# Patient Record
Sex: Female | Born: 1955
Health system: Southern US, Community
[De-identification: ages and names within clinical notes are randomized; demographics above are authoritative.]

## PROBLEM LIST (undated history)

## (undated) DIAGNOSIS — T7840XA Allergy, unspecified, initial encounter: Secondary | ICD-10-CM

## (undated) DIAGNOSIS — F909 Attention-deficit hyperactivity disorder, unspecified type: Secondary | ICD-10-CM

## (undated) DIAGNOSIS — F329 Major depressive disorder, single episode, unspecified: Secondary | ICD-10-CM

## (undated) DIAGNOSIS — K5190 Ulcerative colitis, unspecified, without complications: Secondary | ICD-10-CM

## (undated) DIAGNOSIS — F419 Anxiety disorder, unspecified: Secondary | ICD-10-CM

## (undated) DIAGNOSIS — M1990 Unspecified osteoarthritis, unspecified site: Secondary | ICD-10-CM

## (undated) DIAGNOSIS — F32A Depression, unspecified: Secondary | ICD-10-CM

## (undated) DIAGNOSIS — K219 Gastro-esophageal reflux disease without esophagitis: Secondary | ICD-10-CM

## (undated) HISTORY — PX: BACK SURGERY: SHX140

## (undated) HISTORY — DX: Depression, unspecified: F32.A

## (undated) HISTORY — DX: Major depressive disorder, single episode, unspecified: F32.9

## (undated) HISTORY — DX: Attention-deficit hyperactivity disorder, unspecified type: F90.9

## (undated) HISTORY — PX: JOINT REPLACEMENT: SHX530

## (undated) HISTORY — DX: Ulcerative colitis, unspecified, without complications: K51.90

## (undated) HISTORY — PX: SPINE SURGERY: SHX786

## (undated) HISTORY — DX: Anxiety disorder, unspecified: F41.9

## (undated) HISTORY — PX: BUNIONECTOMY: SHX129

## (undated) HISTORY — DX: Allergy, unspecified, initial encounter: T78.40XA

## (undated) HISTORY — PX: EYE SURGERY: SHX253

## (undated) HISTORY — DX: Unspecified osteoarthritis, unspecified site: M19.90

---

## 1979-06-06 HISTORY — PX: ABDOMINAL HYSTERECTOMY: SHX81

## 2006-11-21 ENCOUNTER — Ambulatory Visit: Payer: Self-pay | Admitting: Family Medicine

## 2007-03-21 ENCOUNTER — Ambulatory Visit: Payer: Self-pay | Admitting: Specialist

## 2008-07-17 IMAGING — CR DG LUMBAR SPINE COMPLETE 4+V
2 series · 5 of 5 positions shown · non-contrast
Comparison: none

REASON FOR EXAM: lower back pain
COMMENTS:

[view not recorded (1 of 2)]
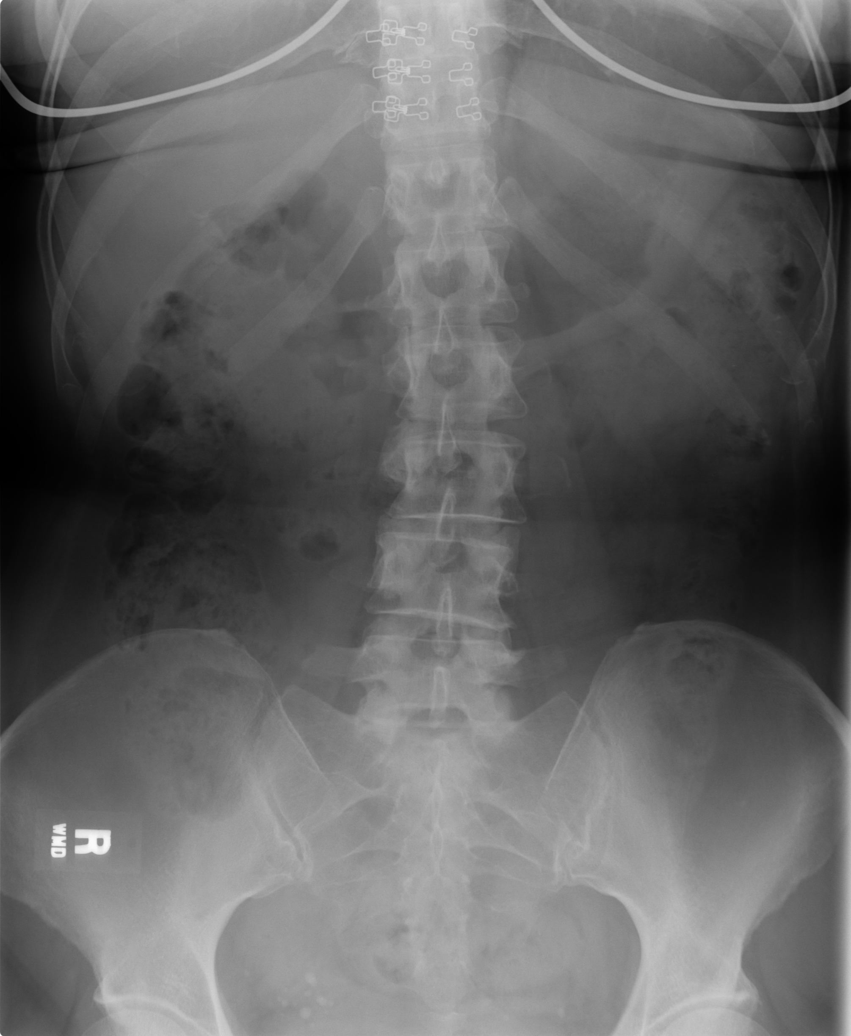

[Series 2: view not recorded · 0.17mm/px · 4 of 4 slices shown (2 of 2)]
[im 1/4]
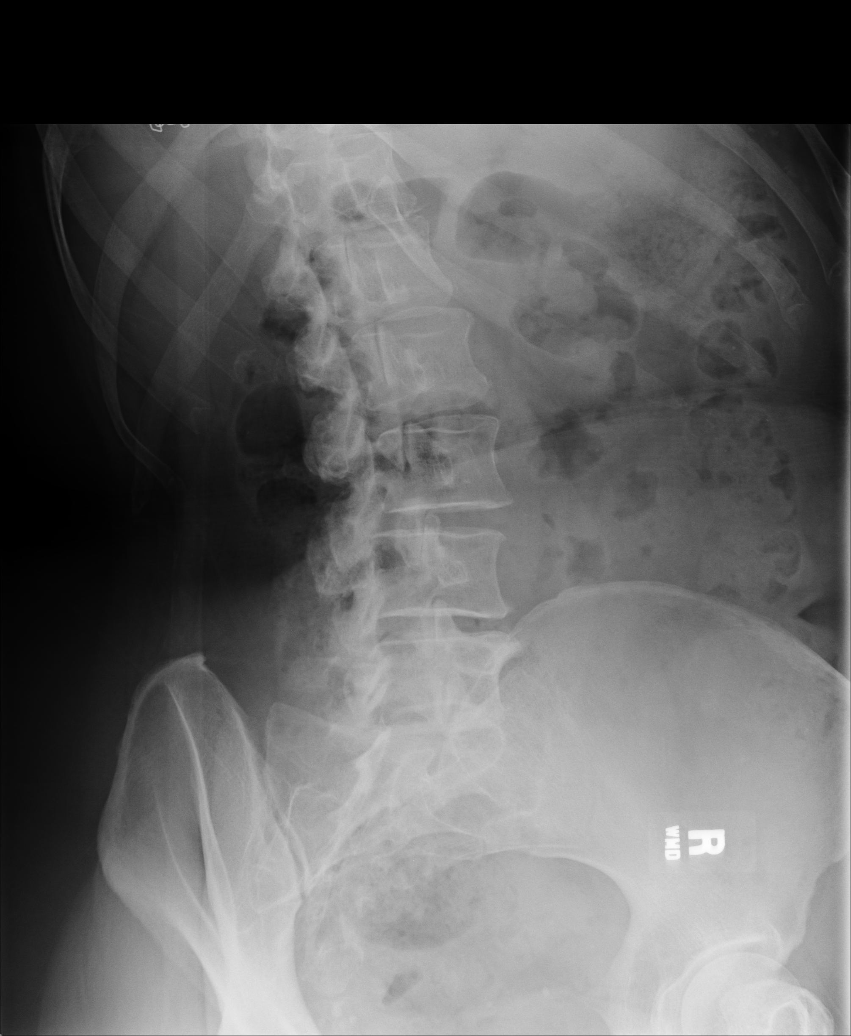
[im 2/4]
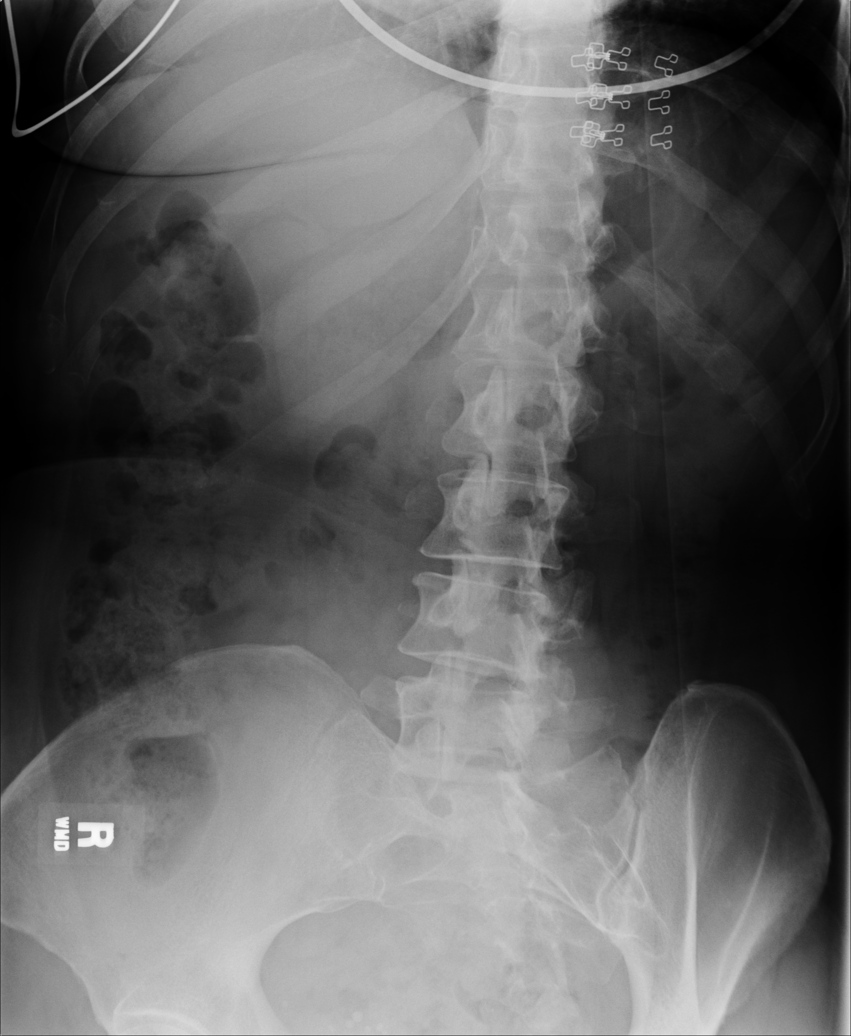
[im 3/4]
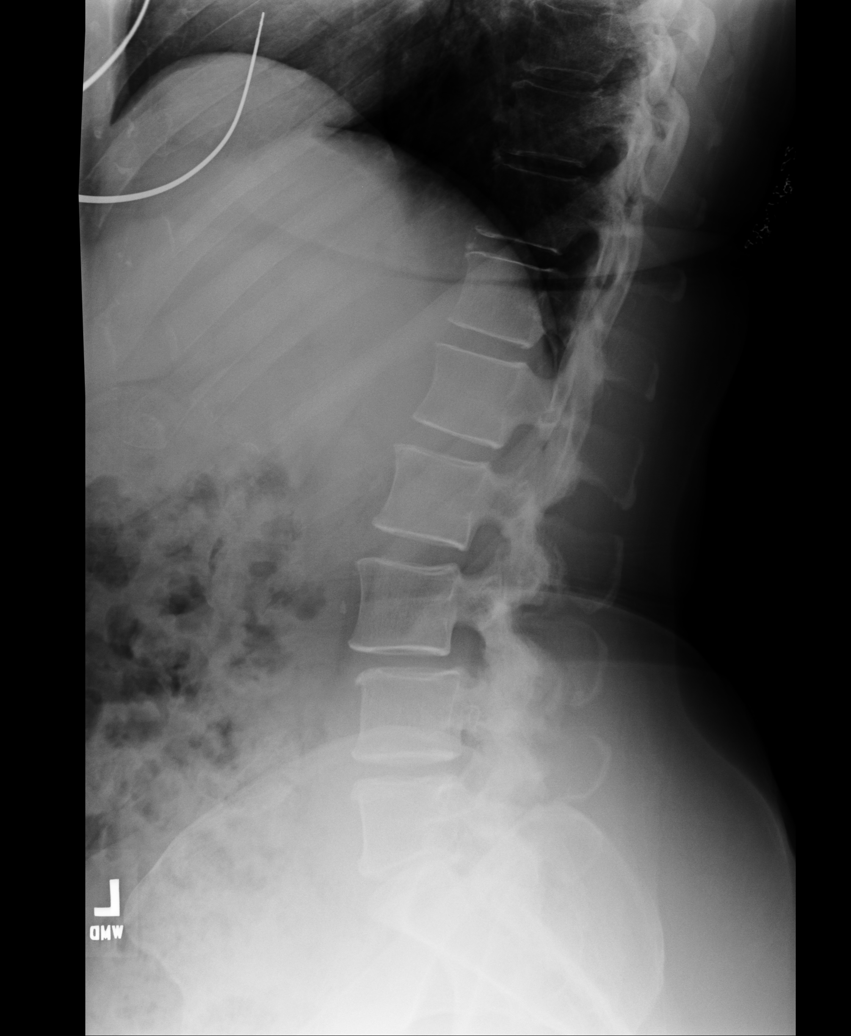
[im 4/4]
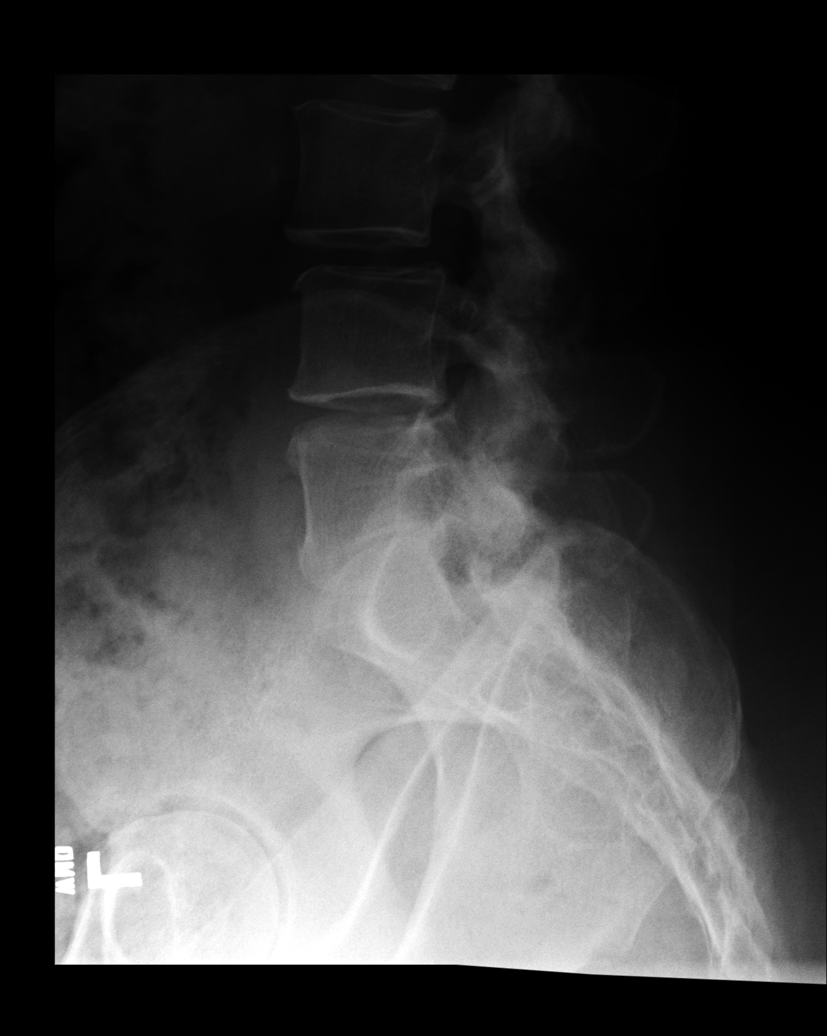

[5 of 5 positions shown; findings below may reference images not displayed]

PROCEDURE:     KDR - KDXR LUMBAR SPINE WITH OBLIQUES  - November 21, 2006 [DATE]

RESULT:     Images of the lumbar spine demonstrate disc space narrowing at
L5-S1 and to a lesser extent L4-L5 with multilevel lower lumbar facet
hypertrophy most pronounced at L4-L5 and L5-S1. No fracture is identified.
There is minimal scoliosis concave to the RIGHT at L2-L3.
IMPRESSION: Bony degenerative changes. No definite fracture
demonstrated. MRI is available for further evaluation if desired.

## 2008-08-11 ENCOUNTER — Ambulatory Visit: Payer: Self-pay | Admitting: Gastroenterology

## 2009-01-20 ENCOUNTER — Ambulatory Visit: Payer: Self-pay | Admitting: Gastroenterology

## 2010-05-31 ENCOUNTER — Ambulatory Visit: Payer: Self-pay | Admitting: Ophthalmology

## 2010-07-27 ENCOUNTER — Ambulatory Visit: Payer: Self-pay | Admitting: Ophthalmology

## 2011-03-01 DIAGNOSIS — K5190 Ulcerative colitis, unspecified, without complications: Secondary | ICD-10-CM | POA: Insufficient documentation

## 2011-03-01 DIAGNOSIS — H2000 Unspecified acute and subacute iridocyclitis: Secondary | ICD-10-CM | POA: Insufficient documentation

## 2011-11-22 DIAGNOSIS — K5150 Left sided colitis without complications: Secondary | ICD-10-CM | POA: Insufficient documentation

## 2012-10-11 LAB — HM PAP SMEAR: HM PAP: NEGATIVE

## 2014-08-07 LAB — HM COLONOSCOPY: HM COLON: NORMAL

## 2014-12-03 ENCOUNTER — Ambulatory Visit: Payer: 59 | Admitting: Psychiatry

## 2014-12-10 ENCOUNTER — Other Ambulatory Visit: Payer: Self-pay

## 2014-12-10 DIAGNOSIS — F988 Other specified behavioral and emotional disorders with onset usually occurring in childhood and adolescence: Secondary | ICD-10-CM | POA: Insufficient documentation

## 2014-12-10 NOTE — Telephone Encounter (Signed)
pt called left message on answering machine that she was out of adderall and needed refill pt has appt on 12-29-14

## 2014-12-14 MED ORDER — AMPHETAMINE-DEXTROAMPHETAMINE 10 MG PO TABS: 10.0000 mg | ORAL_TABLET | Freq: Every day | ORAL | Status: DC

## 2014-12-14 NOTE — Telephone Encounter (Signed)
pt was called rx will be at front desk for pick up. pt states it will be sometime tomorrow

## 2014-12-29 ENCOUNTER — Encounter: Payer: Self-pay | Admitting: Psychiatry

## 2014-12-29 ENCOUNTER — Ambulatory Visit (INDEPENDENT_AMBULATORY_CARE_PROVIDER_SITE_OTHER): Payer: 59 | Admitting: Psychiatry

## 2014-12-29 VITALS — BP 120/72 | HR 84 | Temp 97.9°F | Ht 63.5 in | Wt 188.2 lb

## 2014-12-29 DIAGNOSIS — F900 Attention-deficit hyperactivity disorder, predominantly inattentive type: Secondary | ICD-10-CM | POA: Diagnosis not present

## 2014-12-29 MED ORDER — HYDROXYZINE PAMOATE 50 MG PO CAPS: 100.0000 mg | ORAL_CAPSULE | Freq: Every evening | ORAL | Status: DC | PRN

## 2014-12-29 MED ORDER — AMPHETAMINE-DEXTROAMPHETAMINE 20 MG PO TABS: 20.0000 mg | ORAL_TABLET | ORAL | Status: DC

## 2014-12-29 NOTE — Progress Notes (Signed)
BH MD/PA/NP OP Progress Note  12/29/2014 10:28 AM Alison Kramer  MRN:  [id number]  Subjective:  Patient returns a follow-up of her ADHD. She states the Adderall is been helpful. However she states that the main thing and helps her with is to initiate task however she's continues to report difficulty shifting between different task. She states that overall her mood is been good but she is a little tense and anxious because they have numerous issues going on (i.e. trying to refinance their house). She states however she looks forward to an upcoming trip to the beach.  He stated that she can sleep fairly well with the Vistaril but she still typically been taking 50 mg and then if she wakes up she takes another 50 mg. She states however when she was taking 250 mg at once it allowed her to fall asleep and continue to sleep. She states when she takes single 50 mg dose initially that there is a delay in her being able to fall asleep and that she wakes up at times.  He denies any side effects from the medications such as headache, decreased appetite. Chief Complaint: "Trouble shifting" between tasks Chief Complaint    Follow-up     Visit Diagnosis:  No diagnosis found.  Past Medical History:  Past Medical History  Diagnosis Date  . Anxiety   . Depression   . ADHD (attention deficit hyperactivity disorder)     Past Surgical History  Procedure Laterality Date  . Abdominal hysterectomy    . Bunionectomy    . Back surgery     Family History:  Family History  Problem Relation Age of Onset  . Breast cancer Mother   . Hyperlipidemia Mother   . Heart Problems Father   . Hypertension Brother   . Obesity Brother   . Heart Problems Brother   . Diabetes Brother    Social History:  History   Social History  . Marital Status: Married    Spouse Name: N/A  . Number of Children: N/A  . Years of Education: N/A   Social History Main Topics  . Smoking status: Never Smoker   . Smokeless  tobacco: Never Used  . Alcohol Use: 3.6 - 4.2 oz/week    0 Standard drinks or equivalent, 4 Glasses of wine, 2-3 Shots of liquor per week  . Drug Use: No  . Sexual Activity: Yes    Birth Control/ Protection: None   Other Topics Concern  . None   Social History Narrative  . None   Additional History:   Assessment:   Musculoskeletal: Strength & Muscle Tone: within normal limits Gait & Station: normal Patient leans: N/A  Psychiatric Specialty Exam: HPI  Review of Systems  Psychiatric/Behavioral: Negative for depression, suicidal ideas, hallucinations, memory loss and substance abuse. The patient has insomnia. The patient is not nervous/anxious.     Blood pressure 120/72, pulse 84, temperature 97.9 F (36.6 C), temperature source Tympanic, height 5' 3.5" (1.613 m), weight 188 lb 3.2 oz (85.367 kg), SpO2 96 %.Body mass index is 32.81 kg/(m^2).  General Appearance: Neat and Well Groomed  Eye Contact:  Good  Speech:  Normal Rate  Volume:  Normal  Mood:  Good  Affect:  Congruent  Thought Process:  Linear and Logical  Orientation:  Full (Time, Place, and Person)  Thought Content:  Negative  Suicidal Thoughts:  No  Homicidal Thoughts:  No  Memory:  Immediate;   Good Recent;   Good Remote;  Good  Judgement:  Good  Insight:  Good  Psychomotor Activity:  Negative  Concentration:  Good  Recall:  Good  Fund of Knowledge: Good  Language: Good  Akathisia:  Negative  Handed:  Right unknown   AIMS (if indicated):  N/A  Assets:  Communication Skills Desire for Improvement Social Support  ADL's:  Intact  Cognition: WNL  Sleep:  9 hours  With medicine, some sleep onset insomnia   Is the patient at risk to self?  No. Has the patient been a risk to self in the past 6 months?  No. Has the patient been a risk to self within the distant past?  No. Is the patient a risk to others?  No. Has the patient been a risk to others in the past 6 months?  No. Has the patient been a risk  to others within the distant past?  No.  Current Medications: Current Outpatient Prescriptions  Medication Sig Dispense Refill  . amphetamine-dextroamphetamine (ADDERALL) 20 MG tablet Take 1 tablet (20 mg total) by mouth every morning. 30 tablet 0  . cholecalciferol (VITAMIN D) 1000 UNITS tablet Take by mouth.    . hydrOXYzine (VISTARIL) 50 MG capsule Take 2 capsules (100 mg total) by mouth at bedtime as needed (insomnia). 60 capsule 2  . mesalamine (LIALDA) 1.2 G EC tablet Take by mouth.     No current facility-administered medications for this visit.    Medical Decision Making:  Established Problem, Stable/Improving (1) and Review of New Medication or Change in Dosage (2)  Treatment Plan Summary:Medication management and Plan Will increase her Adderall to 20 mg in the morning. We will change her Vistaril dosing to 100 mg at bedtime as needed. Patient will follow up in 1 month. She's been encouraged call any questions or concerns prior to her next appointment. Given a hard copy prescription for Adderall 20 mg, #30 with no refills. I've sent in to her pharmacy Vistaril 50 mg tablets, #60 with 2 refills.   [name] 12/29/2014, 10:28 AM

## 2015-01-29 ENCOUNTER — Ambulatory Visit (INDEPENDENT_AMBULATORY_CARE_PROVIDER_SITE_OTHER): Payer: 59 | Admitting: Psychiatry

## 2015-01-29 ENCOUNTER — Encounter: Payer: Self-pay | Admitting: Psychiatry

## 2015-01-29 VITALS — BP 125/107 | HR 84 | Temp 98.4°F | Ht 64.0 in | Wt 186.2 lb

## 2015-01-29 DIAGNOSIS — F900 Attention-deficit hyperactivity disorder, predominantly inattentive type: Secondary | ICD-10-CM | POA: Diagnosis not present

## 2015-01-29 DIAGNOSIS — G4700 Insomnia, unspecified: Secondary | ICD-10-CM

## 2015-01-29 MED ORDER — TEMAZEPAM 7.5 MG PO CAPS: 7.5000 mg | ORAL_CAPSULE | Freq: Every evening | ORAL | Status: DC | PRN

## 2015-01-29 MED ORDER — AMPHETAMINE-DEXTROAMPHETAMINE 20 MG PO TABS: 20.0000 mg | ORAL_TABLET | ORAL | Status: DC

## 2015-01-29 NOTE — Progress Notes (Signed)
BH MD/PA/NP OP Progress Note  01/29/2015 8:55 AM Alison Kramer  MRN:  [id number]  Subjective:  Patient returns a follow-up of her ADHD. Patient states that the Adderall continues to work well. She states her main issue today is sleep. States that even with the Vistaril at taking her longtime to go to sleep. We discussed alternatives to Vistaril such as Ambien. However patient was concerned about the risk of behaviors in sleep. As such she indicated she has had a past trial of diazepam and that that worked. I did discuss that if we were going to look at that class perhaps to temezapam would be a better choice. Chief Complaint: "sleep" Chief Complaint    Follow-up; Medication Refill; Fatigue     Visit Diagnosis:  No diagnosis found.  Past Medical History:  Past Medical History  Diagnosis Date  . Anxiety   . Depression   . ADHD (attention deficit hyperactivity disorder)     Past Surgical History  Procedure Laterality Date  . Abdominal hysterectomy    . Bunionectomy    . Back surgery     Family History:  Family History  Problem Relation Age of Onset  . Breast cancer Mother   . Hyperlipidemia Mother   . Heart Problems Father   . Hypertension Brother   . Obesity Brother   . Heart Problems Brother   . Diabetes Brother    Social History:  Social History   Social History  . Marital Status: Married    Spouse Name: N/A  . Number of Children: N/A  . Years of Education: N/A   Social History Main Topics  . Smoking status: Never Smoker   . Smokeless tobacco: Never Used  . Alcohol Use: 3.6 - 4.2 oz/week    0 Standard drinks or equivalent, 4 Glasses of wine, 2-3 Shots of liquor per week  . Drug Use: No  . Sexual Activity: Yes    Birth Control/ Protection: None   Other Topics Concern  . None   Social History Narrative   Additional History:   Assessment:   Musculoskeletal: Strength & Muscle Tone: within normal limits Gait & Station: normal Patient leans:  N/A  Psychiatric Specialty Exam: HPI  Review of Systems  Psychiatric/Behavioral: Negative for depression, suicidal ideas, hallucinations, memory loss and substance abuse. The patient has insomnia. The patient is not nervous/anxious.     Blood pressure 125/107, pulse 84, temperature 98.4 F (36.9 C), temperature source Tympanic, height 5' 4"  (1.626 m), weight 186 lb 3.2 oz (84.46 kg), SpO2 98 %.Body mass index is 31.95 kg/(m^2).  General Appearance: Neat and Well Groomed  Eye Contact:  Good  Speech:  Normal Rate  Volume:  Normal  Mood:  Good  Affect:  Congruent  Thought Process:  Linear and Logical  Orientation:  Full (Time, Place, and Person)  Thought Content:  Negative  Suicidal Thoughts:  No  Homicidal Thoughts:  No  Memory:  Immediate;   Good Recent;   Good Remote;   Good  Judgement:  Good  Insight:  Good  Psychomotor Activity:  Negative  Concentration:  Good  Recall:  Good  Fund of Knowledge: Good  Language: Good  Akathisia:  Negative  Handed:  Right unknown   AIMS (if indicated):  N/A  Assets:  Communication Skills Desire for Improvement Social Support  ADL's:  Intact  Cognition: WNL  Sleep:  9 hours  With medicine, some sleep onset insomnia   Is the patient at risk to self?  No. Has the patient been a risk to self in the past 6 months?  No. Has the patient been a risk to self within the distant past?  No. Is the patient a risk to others?  No. Has the patient been a risk to others in the past 6 months?  No. Has the patient been a risk to others within the distant past?  No.  Current Medications: Current Outpatient Prescriptions  Medication Sig Dispense Refill  . amphetamine-dextroamphetamine (ADDERALL) 20 MG tablet Take 1 tablet (20 mg total) by mouth every morning. 30 tablet 0  . cholecalciferol (VITAMIN D) 1000 UNITS tablet Take by mouth.    . hydrOXYzine (VISTARIL) 50 MG capsule Take 2 capsules (100 mg total) by mouth at bedtime as needed (insomnia). 60  capsule 2  . mesalamine (LIALDA) 1.2 G EC tablet Take by mouth.    . temazepam (RESTORIL) 7.5 MG capsule Take 1 capsule (7.5 mg total) by mouth at bedtime as needed for sleep. 30 capsule 0   No current facility-administered medications for this visit.    Medical Decision Making:  Established Problem, Stable/Improving (1) and Review of New Medication or Change in Dosage (2)  Treatment Plan Summary:Medication management and Plan Will increase her Adderall to 20 mg in the morning. She will discontinue the Vistaril. We will start some temazepam 7.5 millions at bedtime as needed for insomnia. Patient will follow up in 1 month. Risk and benefits of the temazepam of this been discussed and patient is able to consent  [name] 01/29/2015, 8:55 AM

## 2015-03-03 ENCOUNTER — Encounter: Payer: Self-pay | Admitting: Psychiatry

## 2015-03-03 ENCOUNTER — Ambulatory Visit (INDEPENDENT_AMBULATORY_CARE_PROVIDER_SITE_OTHER): Payer: 59 | Admitting: Psychiatry

## 2015-03-03 VITALS — BP 110/70 | HR 101 | Temp 97.9°F | Ht 64.0 in | Wt 180.6 lb

## 2015-03-03 DIAGNOSIS — F900 Attention-deficit hyperactivity disorder, predominantly inattentive type: Secondary | ICD-10-CM | POA: Diagnosis not present

## 2015-03-03 DIAGNOSIS — G4700 Insomnia, unspecified: Secondary | ICD-10-CM

## 2015-03-03 MED ORDER — AMPHETAMINE-DEXTROAMPHETAMINE 20 MG PO TABS: 20.0000 mg | ORAL_TABLET | ORAL | Status: DC

## 2015-03-03 MED ORDER — TEMAZEPAM 15 MG PO CAPS: 15.0000 mg | ORAL_CAPSULE | Freq: Every evening | ORAL | Status: DC | PRN

## 2015-03-03 NOTE — Progress Notes (Signed)
BH MD/PA/NP OP Progress Note  03/03/2015 9:07 AM Alison Kramer  MRN:  [id number]  Subjective:  Patient returns a follow-up of her ADHD and insomnia. She indicates that the Adderall continues to work well for her attention issues. She denies any side effects from the Adderall except perhaps some decreased appetite which she finds a good thing.  He relates her main issue today is that the to [facility] started at the last visit has only been slightly effective for her insomnia. She states that she has been taking the temazepam 7.5 mg with the Benadryl and she has been able to get 7 hours of sleep but the temazepam alone does not make her drowsy.  She continues to go to work and that she has applied for another position and the same company but it might provide more opportunity for advancement. She is waiting to hear she got that position. Chief Complaint: not sleeping Chief Complaint    Follow-up; Medication Refill     Visit Diagnosis:     ICD-9-CM ICD-10-CM   1. Attention deficit hyperactivity disorder (ADHD), predominantly inattentive type 314.01 F90.0   2. Insomnia 780.52 G47.00     Past Medical History:  Past Medical History  Diagnosis Date  . Anxiety   . Depression   . ADHD (attention deficit hyperactivity disorder)     Past Surgical History  Procedure Laterality Date  . Abdominal hysterectomy    . Bunionectomy    . Back surgery     Family History:  Family History  Problem Relation Age of Onset  . Breast cancer Mother   . Hyperlipidemia Mother   . Heart Problems Father   . Hypertension Brother   . Obesity Brother   . Heart Problems Brother   . Diabetes Brother    Social History:  Social History   Social History  . Marital Status: Married    Spouse Name: N/A  . Number of Children: N/A  . Years of Education: N/A   Social History Main Topics  . Smoking status: Never Smoker   . Smokeless tobacco: Never Used  . Alcohol Use: 3.6 - 4.2 oz/week    4 Glasses of wine,  2-3 Shots of liquor, 0 Standard drinks or equivalent per week  . Drug Use: No  . Sexual Activity: Yes    Birth Control/ Protection: None   Other Topics Concern  . None   Social History Narrative   Additional History:   Assessment:   Musculoskeletal: Strength & Muscle Tone: within normal limits Gait & Station: normal Patient leans: N/A  Psychiatric Specialty Exam: HPI  Review of Systems  Psychiatric/Behavioral: Negative for depression, suicidal ideas, hallucinations, memory loss and substance abuse. The patient has insomnia. The patient is not nervous/anxious.     Blood pressure 110/70, pulse 101, temperature 97.9 F (36.6 C), temperature source Tympanic, height 5' 4"  (1.626 m), weight 180 lb 9.6 oz (81.92 kg), SpO2 96 %.Body mass index is 30.98 kg/(m^2).  General Appearance: Neat and Well Groomed  Eye Contact:  Good  Speech:  Normal Rate  Volume:  Normal  Mood:  Good  Affect:  Congruent  Thought Process:  Linear and Logical  Orientation:  Full (Time, Place, and Person)  Thought Content:  Negative  Suicidal Thoughts:  No  Homicidal Thoughts:  No  Memory:  Immediate;   Good Recent;   Good Remote;   Good  Judgement:  Good  Insight:  Good  Psychomotor Activity:  Negative  Concentration:  Good  Recall:  Good  Fund of Knowledge: Good  Language: Good  Akathisia:  Negative  Handed:  Right unknown   AIMS (if indicated):  N/A  Assets:  Communication Skills Desire for Improvement Social Support  ADL's:  Intact  Cognition: WNL  Sleep:  9 hours  With medicine, some sleep onset insomnia   Is the patient at risk to self?  No. Has the patient been a risk to self in the past 6 months?  No. Has the patient been a risk to self within the distant past?  No. Is the patient a risk to others?  No. Has the patient been a risk to others in the past 6 months?  No. Has the patient been a risk to others within the distant past?  No.  Current Medications: Current Outpatient  Prescriptions  Medication Sig Dispense Refill  . amphetamine-dextroamphetamine (ADDERALL) 20 MG tablet Take 1 tablet (20 mg total) by mouth every morning. 30 tablet 0  . cholecalciferol (VITAMIN D) 1000 UNITS tablet Take by mouth.    . mesalamine (LIALDA) 1.2 G EC tablet Take by mouth.    . temazepam (RESTORIL) 15 MG capsule Take 1 capsule (15 mg total) by mouth at bedtime as needed for sleep. 30 capsule 1   No current facility-administered medications for this visit.    Medical Decision Making:  Established Problem, Stable/Improving (1) and Review of New Medication or Change in Dosage (2)  Treatment Plan Summary:Medication management and Plan   ADHD-continue Adderall to 20 mg in the morning.    Insomnia increased temazepam to 15 mg at bedtime as needed for insomnia.   Patient will follow up in 1 month.    [name] 03/03/2015, 9:07 AM

## 2015-03-23 ENCOUNTER — Ambulatory Visit: Payer: 59 | Admitting: Psychiatry

## 2015-03-24 ENCOUNTER — Other Ambulatory Visit: Payer: Self-pay

## 2015-03-24 ENCOUNTER — Encounter: Payer: Self-pay | Admitting: Family Medicine

## 2015-03-24 ENCOUNTER — Ambulatory Visit (INDEPENDENT_AMBULATORY_CARE_PROVIDER_SITE_OTHER): Payer: 59 | Admitting: Family Medicine

## 2015-03-24 VITALS — BP 120/70 | HR 60 | Temp 98.0°F | Resp 16 | Ht 64.0 in | Wt 180.0 lb

## 2015-03-24 DIAGNOSIS — N6489 Other specified disorders of breast: Secondary | ICD-10-CM | POA: Diagnosis not present

## 2015-03-24 DIAGNOSIS — N649 Disorder of breast, unspecified: Secondary | ICD-10-CM

## 2015-03-24 NOTE — Progress Notes (Signed)
Patient ID: Alison Kramer, female   DOB: [birth year], 59 y.o.   MRN: [id number]        Patient: Alison Kramer Female    DOB: 24-Oct-1955   2 y.o.   MRN: [id number] Visit Date: 03/24/2015  Today's Provider: Margarita Rana, MD   Chief Complaint  Patient presents with  . Breast Mass   Subjective:    HPI  Breast lump Patient reports she noticed a lump on her right breast yesterday. Patient reports redness, and warm to the touch yesterday. Patient denies injury, and discharge. Patient reports it tender to touch. Patient reports she has been checking her breast regularly.     No Known Allergies Previous Medications   AMPHETAMINE-DEXTROAMPHETAMINE (ADDERALL) 20 MG TABLET    Take 1 tablet (20 mg total) by mouth every morning.   ASTAXANTHIN 5 MG CAPS    Take 1 capsule by mouth daily.   CHOLECALCIFEROL (VITAMIN D) 1000 UNITS TABLET    Take by mouth.   MESALAMINE (LIALDA) 1.2 G EC TABLET    Take by mouth.   MULTIPLE VITAMIN (MULTIVITAMIN) CAPSULE    Take 1 capsule by mouth.   SACCHAROMYCES BOULARDII (FLORASTOR) 250 MG CAPSULE    Take by mouth.   TEMAZEPAM (RESTORIL) 15 MG CAPSULE    Take 1 capsule (15 mg total) by mouth at bedtime as needed for sleep.    Review of Systems  Constitutional: Negative.   Skin: Positive for rash (lump on right breast).    Social History  Substance Use Topics  . Smoking status: Never Smoker   . Smokeless tobacco: Never Used  . Alcohol Use: 3.6 - 4.2 oz/week    4 Glasses of wine, 2-3 Shots of liquor, 0 Standard drinks or equivalent per week   Objective:   BP 120/70 mmHg  Pulse 60  Temp(Src) 98 F (36.7 C) (Oral)  Resp 16  Ht 5' 4"  (1.626 m)  Wt 180 lb (81.647 kg)  BMI 30.88 kg/m2  Physical Exam  Constitutional: She is oriented to person, place, and time. She appears well-developed and well-nourished.  Pulmonary/Chest:  4 mm circular erythemis area at 9 o'clock right nipple.   Genitourinary: No breast swelling, tenderness, discharge or  bleeding.  Neurological: She is alert and oriented to person, place, and time.  Psychiatric: She has a normal mood and affect. Her behavior is normal. Judgment and thought content normal.      Assessment & Plan:     1. Nipple lesion New problem.  Exam as above. F/U pending report.  - MM Digital Diagnostic Bilat; Future - US BREAST LTD UNI RIGHT INC AXILLA; Future      Patient seen and examined by Dr. Jerrell Belfast, and note scribed by Philbert Riser. Dimas, CMA.  I have reviewed the document for accuracy and completeness and I agree with above. - Jerrell Belfast, MD     Margarita Rana, MD  [facility]

## 2015-03-25 ENCOUNTER — Telehealth: Payer: Self-pay

## 2015-03-25 NOTE — Telephone Encounter (Signed)
Patient advised of diagnostic mammogram scheduled on 04/07/2015 at 10:40 am. I provided pt with [facility] # [phone]. sd

## 2015-04-07 ENCOUNTER — Other Ambulatory Visit: Payer: Self-pay | Admitting: Family Medicine

## 2015-04-07 ENCOUNTER — Ambulatory Visit
Admission: RE | Admit: 2015-04-07 | Discharge: 2015-04-07 | Disposition: A | Discharge status: Home / Self Care | Payer: 59 | Source: Ambulatory Visit | Attending: Family Medicine | Admitting: Family Medicine

## 2015-04-07 DIAGNOSIS — N649 Disorder of breast, unspecified: Secondary | ICD-10-CM

## 2015-04-07 DIAGNOSIS — N6489 Other specified disorders of breast: Secondary | ICD-10-CM | POA: Diagnosis not present

## 2015-05-17 ENCOUNTER — Telehealth (HOSPITAL_COMMUNITY): Payer: Self-pay

## 2015-05-17 ENCOUNTER — Encounter: Payer: Self-pay | Admitting: Psychiatry

## 2015-05-17 ENCOUNTER — Ambulatory Visit (INDEPENDENT_AMBULATORY_CARE_PROVIDER_SITE_OTHER): Payer: 59 | Admitting: Psychiatry

## 2015-05-17 VITALS — BP 118/62 | HR 93 | Temp 98.3°F | Ht 64.0 in | Wt 168.2 lb

## 2015-05-17 DIAGNOSIS — F900 Attention-deficit hyperactivity disorder, predominantly inattentive type: Secondary | ICD-10-CM

## 2015-05-17 DIAGNOSIS — G4700 Insomnia, unspecified: Secondary | ICD-10-CM

## 2015-05-17 MED ORDER — AMPHETAMINE-DEXTROAMPHETAMINE 20 MG PO TABS: 20.0000 mg | ORAL_TABLET | ORAL | Status: DC

## 2015-05-17 MED ORDER — TEMAZEPAM 15 MG PO CAPS
15.0000 mg | ORAL_CAPSULE | Freq: Every evening | ORAL | Status: DC | PRN
Start: 2015-05-17 — End: 2015-08-24

## 2015-05-17 NOTE — Telephone Encounter (Signed)
Patient seen today. Refills provided. AW

## 2015-05-17 NOTE — Progress Notes (Signed)
BH MD/PA/NP OP Progress Note  05/17/2015 3:22 PM Alison Kramer  MRN:  [id number]  Subjective:  Patient returns a follow-up of her ADHD and insomnia.  Patient states things continue to go well.   Did ask her if she had enough Adderall as it appears last prescription I wrote was in September. Patient states she did because she does not take it on the weekends. [name] she continues to go to work. She states she's planning for the upcoming holidays by shopping. She states that she in her husband plan a trip to [place] to see her son and some grandchildren. They state that  Grandchild will accompany them on the trip.   Eyes any side effects or medications. Cates that the increase in the temazepam at the last visit  Has helped her sleep. She states she is largely sleeping a good 8 hours per night Chief Complaint:  good Chief Complaint    Follow-up; Medication Refill     Visit Diagnosis:     ICD-9-CM ICD-10-CM   1. Attention deficit hyperactivity disorder (ADHD), predominantly inattentive type 314.01 F90.0   2. Insomnia 780.52 G47.00     Past Medical History:  Past Medical History  Diagnosis Date  . Anxiety   . Depression   . ADHD (attention deficit hyperactivity disorder)     Past Surgical History  Procedure Laterality Date  . Abdominal hysterectomy    . Bunionectomy    . Back surgery     Family History:  Family History  Problem Relation Age of Onset  . Breast cancer Mother   . Hyperlipidemia Mother   . Heart Problems Father   . Hypertension Brother   . Obesity Brother   . Heart Problems Brother   . Diabetes Brother    Social History:  Social History   Social History  . Marital Status: Married    Spouse Name: N/A  . Number of Children: N/A  . Years of Education: N/A   Social History Main Topics  . Smoking status: Never Smoker   . Smokeless tobacco: Never Used  . Alcohol Use: 1.2 - 4.2 oz/week    2-3 Shots of liquor, 0 Standard drinks or equivalent, 0-4 Glasses of  wine per week  . Drug Use: No  . Sexual Activity: Yes    Birth Control/ Protection: None   Other Topics Concern  . None   Social History Narrative   Additional History:   Assessment:   Musculoskeletal: Strength & Muscle Tone: within normal limits Gait & Station: normal Patient leans: N/A  Psychiatric Specialty Exam: HPI  Review of Systems  Psychiatric/Behavioral: Negative for depression, suicidal ideas, hallucinations, memory loss and substance abuse. The patient is not nervous/anxious and does not have insomnia.   All other systems reviewed and are negative.   Blood pressure 118/62, pulse 93, temperature 98.3 F (36.8 C), temperature source Tympanic, height 5' 4"  (1.626 m), weight 168 lb 3.2 oz (76.295 kg), SpO2 96 %.Body mass index is 28.86 kg/(m^2).  General Appearance: Neat and Well Groomed  Eye Contact:  Good  Speech:  Normal Rate  Volume:  Normal  Mood:  Good  Affect:  Congruent  Thought Process:  Linear and Logical  Orientation:  Full (Time, Place, and Person)  Thought Content:  Negative  Suicidal Thoughts:  No  Homicidal Thoughts:  No  Memory:  Immediate;   Good Recent;   Good Remote;   Good  Judgement:  Good  Insight:  Good  Psychomotor Activity:  Negative  Concentration:  Good  Recall:  Good  Fund of Knowledge: Good  Language: Good  Akathisia:  Negative  Handed:  Right unknown   AIMS (if indicated):  N/A  Assets:  Communication Skills Desire for Improvement Social Support  ADL's:  Intact  Cognition: WNL  Sleep:  9 hours  With medicine, some sleep onset insomnia   Is the patient at risk to self?  No. Has the patient been a risk to self in the past 6 months?  No. Has the patient been a risk to self within the distant past?  No. Is the patient a risk to others?  No. Has the patient been a risk to others in the past 6 months?  No. Has the patient been a risk to others within the distant past?  No.  Current Medications: Current Outpatient  Prescriptions  Medication Sig Dispense Refill  . amphetamine-dextroamphetamine (ADDERALL) 20 MG tablet Take 1 tablet (20 mg total) by mouth every morning. 30 tablet 0  . cholecalciferol (VITAMIN D) 1000 UNITS tablet Take by mouth.    . mesalamine (LIALDA) 1.2 G EC tablet Take by mouth.    . saccharomyces boulardii (FLORASTOR) 250 MG capsule Take by mouth.    . temazepam (RESTORIL) 15 MG capsule Take 1 capsule (15 mg total) by mouth at bedtime as needed for sleep. 30 capsule 4   No current facility-administered medications for this visit.    Medical Decision Making:  Established Problem, Stable/Improving (1) and Review of New Medication or Change in Dosage (2)  Treatment Plan Summary:Medication management and Plan   ADHD-continue Adderall to 20 mg in the morning.    she has been given a prescription to fill this month as well as one to fill in January. She is aware she'll need to call the clinic to have a prescription for February  Insomnia continue temazepam to 15 mg at bedtime as needed for insomnia.   Patient will follow up in 3 months.  I have informed her that I will be departing the clinic in February 2017 and it would be a new provider here in the clinic to continue her follow-ups.   [name] 05/17/2015, 3:22 PM

## 2015-05-17 NOTE — Telephone Encounter (Signed)
pt was made an appt for today.  pt states she needs refill on her medication. pt has not been seen since 03-03-15 and no follow up appt was made.

## 2015-08-05 ENCOUNTER — Ambulatory Visit (INDEPENDENT_AMBULATORY_CARE_PROVIDER_SITE_OTHER): Payer: 59 | Admitting: Family Medicine

## 2015-08-05 ENCOUNTER — Encounter: Payer: Self-pay | Admitting: Family Medicine

## 2015-08-05 VITALS — BP 118/60 | HR 88 | Temp 98.1°F | Resp 16 | Wt 169.0 lb

## 2015-08-05 DIAGNOSIS — M545 Low back pain: Secondary | ICD-10-CM

## 2015-08-05 DIAGNOSIS — Z0000 Encounter for general adult medical examination without abnormal findings: Secondary | ICD-10-CM

## 2015-08-05 MED ORDER — CYCLOBENZAPRINE HCL 5 MG PO TABS: 5.0000 mg | ORAL_TABLET | Freq: Every day | ORAL | Status: DC

## 2015-08-05 NOTE — Progress Notes (Signed)
Patient: Alison Kramer, Female    DOB: [birth year], 60 y.o.   MRN: [id number] Visit Date: 08/07/2015  Today's Provider: Margarita Rana, MD   Chief Complaint  Patient presents with  . Annual Exam  . Back Pain   Subjective:    Annual physical exam Alison Kramer is a 60 y.o. female who presents today for health maintenance and complete physical. She feels fairly well.  She reports exercising regularly. She reports she is sleeping well.  -----------------------------------------------------------------  Back Pain This is a new problem. The current episode started in the past 7 days. The problem is unchanged. The pain is present in the lumbar spine. The quality of the pain is described as aching and stabbing. The pain radiates to the left thigh. The pain is at a severity of 6/10. The symptoms are aggravated by bending, twisting and position. Pertinent negatives include no abdominal pain. She has tried ice, chiropractic manipulation and NSAIDs for the symptoms. The treatment provided mild relief.     Review of Systems  Constitutional: Negative.   HENT: Positive for rhinorrhea. Negative for congestion, dental problem, drooling, ear discharge, ear pain, facial swelling, hearing loss, mouth sores, nosebleeds, postnasal drip, sinus pressure, sneezing, sore throat, tinnitus, trouble swallowing and voice change.   Eyes: Negative.   Respiratory: Negative.   Cardiovascular: Negative.   Gastrointestinal: Positive for blood in stool. Negative for nausea, vomiting, abdominal pain, diarrhea, constipation, abdominal distention, anal bleeding and rectal pain.       Has a history of Hemorrhoids. She has had a recent colonoscopy.   Endocrine: Negative.   Genitourinary: Negative.   Musculoskeletal: Positive for back pain. Negative for myalgias, joint swelling, arthralgias, gait problem, neck pain and neck stiffness.  Skin: Negative.   Allergic/Immunologic: Positive for environmental allergies.  Negative for food allergies and immunocompromised state.  Neurological: Negative.   Hematological: Negative.   Psychiatric/Behavioral: Negative.     Social History      She  reports that she has never smoked. She has never used smokeless tobacco. She reports that she drinks about 1.2 - 4.2 oz of alcohol per week. She reports that she does not use illicit drugs.       Social History   Social History  . Marital Status: Married    Spouse Name: N/A  . Number of Children: N/A  . Years of Education: N/A   Social History Main Topics  . Smoking status: Never Smoker   . Smokeless tobacco: Never Used  . Alcohol Use: 1.2 - 4.2 oz/week    0-4 Glasses of wine, 2-3 Shots of liquor, 0 Standard drinks or equivalent per week  . Drug Use: No  . Sexual Activity: Yes    Birth Control/ Protection: None   Other Topics Concern  . None   Social History Narrative    Past Medical History  Diagnosis Date  . Anxiety   . Depression   . ADHD (attention deficit hyperactivity disorder)      Patient Active Problem List   Diagnosis Date Noted  . ADD (attention deficit disorder) 12/10/2014  . Chronic left-sided ulcerative colitis ([place]) 11/22/2011  . Acute or subacute iridocyclitis 03/01/2011  . Colitis gravis ([place]) 03/01/2011    Past Surgical History  Procedure Laterality Date  . Abdominal hysterectomy    . Bunionectomy    . Back surgery      Family History        Family Status  Relation Status Death Age  .  Mother Deceased   . Father Alive   . Brother Alive   . Brother Alive   . Brother Alive   . Brother Alive         Her family history includes Breast cancer in her mother; CAD in her father; Diabetes in her brother; Heart Problems in her brother and father; Hyperlipidemia in her mother; Hypertension in her brother; Obesity in her brother; Pancreatic disease in her father.    No Known Allergies  Previous Medications   AMPHETAMINE-DEXTROAMPHETAMINE (ADDERALL) 20 MG TABLET    Take 1  tablet (20 mg total) by mouth every morning.   CHOLECALCIFEROL (VITAMIN D) 1000 UNITS TABLET    Take by mouth.   MESALAMINE (LIALDA) 1.2 G EC TABLET    Take by mouth.   SACCHAROMYCES BOULARDII (FLORASTOR) 250 MG CAPSULE    Take by mouth.   TEMAZEPAM (RESTORIL) 15 MG CAPSULE    Take 1 capsule (15 mg total) by mouth at bedtime as needed for sleep.    Patient Care Team: Margarita Rana, MD as PCP - General (Family Medicine)     Objective:   Vitals: BP 118/60 mmHg  Pulse 88  Temp(Src) 98.1 F (36.7 C) (Oral)  Resp 16  Wt 169 lb (76.658 kg)   Physical Exam  Constitutional: She is oriented to person, place, and time. She appears well-developed and well-nourished.  HENT:  Head: Normocephalic and atraumatic.  Right Ear: External ear normal.  Left Ear: External ear normal.  Nose: Nose normal.  Mouth/Throat: Oropharynx is clear and moist.  Eyes: Conjunctivae and EOM are normal. Pupils are equal, round, and reactive to light.  Neck: Normal range of motion. Neck supple. Carotid bruit is not present.  Cardiovascular: Normal rate and regular rhythm.   Pulmonary/Chest: Effort normal and breath sounds normal.  Abdominal: Soft. Bowel sounds are normal.  Musculoskeletal: Normal range of motion. She exhibits tenderness.  Straight leg raises negative.    Neurological: She is alert and oriented to person, place, and time.  Psychiatric: She has a normal mood and affect. Her behavior is normal. Judgment and thought content normal.      Assessment & Plan:     Routine Health Maintenance and Physical Exam  Exercise Activities and Dietary recommendations Goals    None      Immunization History  Administered Date(s) Administered  . Influenza,inj,Quad PF,36+ Mos 04/12/2015  . Td 02/14/2008  . Tdap 02/14/2008  . Zoster 05/12/2011    Health Maintenance  Topic Date Due  . PAP SMEAR  10/12/2015  . INFLUENZA VACCINE  01/04/2016  . MAMMOGRAM  04/06/2017  . TETANUS/TDAP  02/13/2018  .  COLONOSCOPY  08/06/2024  . ZOSTAVAX  Completed  . Hepatitis C Screening  Completed  . HIV Screening  Completed      Discussed health benefits of physical activity, and encouraged her to engage in regular exercise appropriate for her age and condition.   1. Annual physical exam Eat healthy and exercise.   - CBC with Differential/Platelet - Comprehensive metabolic panel - TSH - Lipid panel  2. Left low back pain, with sciatica presence unspecified New problem.   Will add muscle relaxer.  Patient instructed to call back if condition worsens or does not improve.    - cyclobenzaprine (FLEXERIL) 5 MG tablet; Take 1 tablet (5 mg total) by mouth at bedtime.  Dispense: 30 tablet; Refill: 1   Patient was seen and examined by Jerrell Belfast, MD, and note scribed by Ashley Royalty,  CMA.  I have reviewed the document for accuracy and completeness and I agree with above. Jerrell Belfast, MD   Margarita Rana, MD    --------------------------------------------------------------------

## 2015-08-10 LAB — COMPREHENSIVE METABOLIC PANEL
ALBUMIN: 4 g/dL (ref 3.6–4.8)
ALK PHOS: 53 IU/L (ref 39–117)
ALT: 13 IU/L (ref 0–32)
AST: 18 IU/L (ref 0–40)
Albumin/Globulin Ratio: 1.4 (ref 1.1–2.5)
BUN / CREAT RATIO: 14 (ref 11–26)
BUN: 13 mg/dL (ref 8–27)
Bilirubin Total: <0.2 mg/dL (ref 0.0–1.2)
CO2: 23 mmol/L (ref 18–29)
CREATININE: 0.92 mg/dL (ref 0.57–1.00)
Calcium: 9.3 mg/dL (ref 8.7–10.3)
Chloride: 104 mmol/L (ref 96–106)
GFR calc Af Amer: 78 mL/min/{1.73_m2} (ref >59)
GFR calc non Af Amer: 68 mL/min/{1.73_m2} (ref >59)
Globulin, Total: 2.9 g/dL (ref 1.5–4.5)
Glucose: 76 mg/dL (ref 65–99)
POTASSIUM: 4.4 mmol/L (ref 3.5–5.2)
Sodium: 141 mmol/L (ref 134–144)
TOTAL PROTEIN: 6.9 g/dL (ref 6.0–8.5)

## 2015-08-10 LAB — CBC WITH DIFFERENTIAL/PLATELET
BASOS ABS: 0.1 10*3/uL (ref 0.0–0.2)
Basos: 1 %
EOS (ABSOLUTE): 0.6 10*3/uL — AB (ref 0.0–0.4)
Eos: 9 %
HEMOGLOBIN: 10.5 g/dL — AB (ref 11.1–15.9)
Hematocrit: 33.3 % — ABNORMAL LOW (ref 34.0–46.6)
IMMATURE GRANS (ABS): 0 10*3/uL (ref 0.0–0.1)
Immature Granulocytes: 0 %
LYMPHS: 23 %
Lymphocytes Absolute: 1.6 10*3/uL (ref 0.7–3.1)
MCH: 28.8 pg (ref 26.6–33.0)
MCHC: 31.5 g/dL (ref 31.5–35.7)
MCV: 92 fL (ref 79–97)
MONOCYTES: 15 %
Monocytes Absolute: 1.1 10*3/uL — ABNORMAL HIGH (ref 0.1–0.9)
Neutrophils Absolute: 3.6 10*3/uL (ref 1.4–7.0)
Neutrophils: 52 %
PLATELETS: 427 10*3/uL — AB (ref 150–379)
RBC: 3.64 x10E6/uL — AB (ref 3.77–5.28)
RDW: 14 % (ref 12.3–15.4)
WBC: 7 10*3/uL (ref 3.4–10.8)

## 2015-08-10 LAB — LIPID PANEL
CHOLESTEROL TOTAL: 168 mg/dL (ref 100–199)
Chol/HDL Ratio: 3.3 ratio units (ref 0.0–4.4)
HDL: 51 mg/dL (ref >39)
LDL CALC: 95 mg/dL (ref 0–99)
TRIGLYCERIDES: 110 mg/dL (ref 0–149)
VLDL CHOLESTEROL CAL: 22 mg/dL (ref 5–40)

## 2015-08-10 LAB — TSH: TSH: 2.44 u[IU]/mL (ref 0.450–4.500)

## 2015-08-11 ENCOUNTER — Ambulatory Visit: Payer: 59 | Admitting: Psychiatry

## 2015-08-11 ENCOUNTER — Telehealth: Payer: Self-pay

## 2015-08-11 DIAGNOSIS — D649 Anemia, unspecified: Secondary | ICD-10-CM

## 2015-08-11 NOTE — Telephone Encounter (Signed)
-----   Message from Margarita Rana, MD sent at 08/11/2015  6:49 AM EST ----- Labs stable except for mildly low hgb. Please see if this has been issue in past. Check OC light, repeat CBC with iron studies.  Thanks.

## 2015-08-11 NOTE — Telephone Encounter (Signed)
Advised patient of results. Patient reports that she has had issues in the past with low hgb. Ordered tests and left up front for pick up.

## 2015-08-13 ENCOUNTER — Telehealth: Payer: Self-pay

## 2015-08-13 DIAGNOSIS — E611 Iron deficiency: Secondary | ICD-10-CM

## 2015-08-13 LAB — CBC WITH DIFFERENTIAL/PLATELET
BASOS: 0 %
Basophils Absolute: 0 10*3/uL (ref 0.0–0.2)
EOS (ABSOLUTE): 0.7 10*3/uL — ABNORMAL HIGH (ref 0.0–0.4)
Eos: 9 %
HEMATOCRIT: 31.1 % — AB (ref 34.0–46.6)
HEMOGLOBIN: 10 g/dL — AB (ref 11.1–15.9)
IMMATURE GRANULOCYTES: 0 %
Immature Grans (Abs): 0 10*3/uL (ref 0.0–0.1)
LYMPHS ABS: 1.8 10*3/uL (ref 0.7–3.1)
Lymphs: 21 %
MCH: 28.4 pg (ref 26.6–33.0)
MCHC: 32.2 g/dL (ref 31.5–35.7)
MCV: 88 fL (ref 79–97)
MONOCYTES: 13 %
MONOS ABS: 1.1 10*3/uL — AB (ref 0.1–0.9)
NEUTROS PCT: 57 %
Neutrophils Absolute: 4.7 10*3/uL (ref 1.4–7.0)
Platelets: 467 10*3/uL — ABNORMAL HIGH (ref 150–379)
RBC: 3.52 x10E6/uL — ABNORMAL LOW (ref 3.77–5.28)
RDW: 14.1 % (ref 12.3–15.4)
WBC: 8.4 10*3/uL (ref 3.4–10.8)

## 2015-08-13 LAB — IRON AND TIBC
Iron Saturation: 33 % (ref 15–55)
Iron: 120 ug/dL (ref 27–159)
TIBC: 367 ug/dL (ref 250–450)
UIBC: 247 ug/dL (ref 131–425)

## 2015-08-13 LAB — FERRITIN: Ferritin: 14 ng/mL — ABNORMAL LOW (ref 15–150)

## 2015-08-13 NOTE — Telephone Encounter (Signed)
-----   Message from Margarita Rana, MD sent at 08/13/2015  1:41 PM EST ----- Is iron deficient. Please see if this has been an issue previously. Also, did not see result of OC light. Thanks.

## 2015-08-13 NOTE — Telephone Encounter (Signed)
Iron low. Would like her to do OC Light test. Thanks.

## 2015-08-13 NOTE — Telephone Encounter (Signed)
Advised patient as below. Patient reports that she did not get a OC light test. Patient reports that she has had anemia before in the past, but it was a long time ago.

## 2015-08-16 MED ORDER — FERROUS SULFATE 325 (65 FE) MG PO TABS: 325.0000 mg | ORAL_TABLET | Freq: Every day | ORAL | Status: DC

## 2015-08-16 NOTE — Telephone Encounter (Signed)
Pt reports her Ulcerative Collitis has flared and she does see blood in her stool.  This is currently be followed by [facility].  Advised pt to start an iron supplement rx sent to [place].   Thanks,   -Mickel Baas

## 2015-08-24 ENCOUNTER — Encounter: Payer: Self-pay | Admitting: Psychiatry

## 2015-08-24 ENCOUNTER — Ambulatory Visit (INDEPENDENT_AMBULATORY_CARE_PROVIDER_SITE_OTHER): Payer: 59 | Admitting: Psychiatry

## 2015-08-24 DIAGNOSIS — G4700 Insomnia, unspecified: Secondary | ICD-10-CM | POA: Diagnosis not present

## 2015-08-24 DIAGNOSIS — F909 Attention-deficit hyperactivity disorder, unspecified type: Secondary | ICD-10-CM | POA: Diagnosis not present

## 2015-08-24 MED ORDER — AMPHETAMINE-DEXTROAMPHETAMINE 20 MG PO TABS: 20.0000 mg | ORAL_TABLET | ORAL | Status: DC

## 2015-08-24 MED ORDER — TRAZODONE HCL 100 MG PO TABS: 100.0000 mg | ORAL_TABLET | Freq: Every day | ORAL | Status: DC

## 2015-08-24 NOTE — Progress Notes (Signed)
Patient ID: Alison Kramer, female   DOB: 05-17-1956, 60 y.o.   MRN: [id number] [facility] MD/PA/NP OP Progress Note  08/24/2015 8:34 AM IVETH HEIDEMANN  MRN:  [id number]  Subjective:  Patient returns a follow-up of her ADHD and insomnia.  Patient was previously seen by Dr. Jimmye Norman. Patient reports that she started seeing Dr. Jimmye Norman for insomnia. States that the temazepam 15 mg was somewhat helpful but over the past week she has had trouble sleeping. She also reports that she and her husband just moved from their large house to one bedroom apartment as they decide what to do next. States that there have been trying to downsize. Patient reports her mood is stable and she has had no anxiety. States that currently it is strictly slightly stressful at work since she has started a new project. But states that this is the nature of her job and it comes and goes. Reports good relationship with husband and her sons. Denies any suicidal thoughts. States that she takes Adderall in the morning and it does help her focus.    Chief Complaint:  Difficulty sleeping Chief Complaint    Follow-up; Medication Refill; Insomnia     Visit Diagnosis:   No diagnosis found.  Past Medical History:  Past Medical History  Diagnosis Date  . Anxiety   . Depression   . ADHD (attention deficit hyperactivity disorder)     Past Surgical History  Procedure Laterality Date  . Abdominal hysterectomy    . Bunionectomy    . Back surgery     Family History:  Family History  Problem Relation Age of Onset  . Breast cancer Mother   . Hyperlipidemia Mother   . Heart Problems Father   . CAD Father   . Pancreatic disease Father   . Hypertension Brother   . Obesity Brother   . Heart Problems Brother   . Diabetes Brother    Social History:  Social History   Social History  . Marital Status: Married    Spouse Name: N/A  . Number of Children: N/A  . Years of Education: N/A   Social History Main Topics  . Smoking status:  Never Smoker   . Smokeless tobacco: Never Used  . Alcohol Use: 1.2 - 4.2 oz/week    0-4 Glasses of wine, 2-3 Shots of liquor, 0 Standard drinks or equivalent, 0 Cans of beer per week  . Drug Use: No  . Sexual Activity: Yes    Birth Control/ Protection: None   Other Topics Concern  . None   Social History Narrative   Additional History:   Assessment:   Musculoskeletal: Strength & Muscle Tone: within normal limits Gait & Station: normal Patient leans: N/A  Psychiatric Specialty Exam: Insomnia PMH includes: no depression.    Review of Systems  Psychiatric/Behavioral: Negative for depression, suicidal ideas, hallucinations, memory loss and substance abuse. The patient has insomnia. The patient is not nervous/anxious.   All other systems reviewed and are negative.   Blood pressure 122/64, pulse 110, temperature 98.4 F (36.9 C), temperature source Tympanic, height 5' 4"  (1.626 m), weight 167 lb 9.6 oz (76.023 kg), SpO2 97 %.Body mass index is 28.75 kg/(m^2).  General Appearance: Neat and Well Groomed  Eye Contact:  Good  Speech:  Normal Rate  Volume:  Normal  Mood:  Good  Affect:  Congruent  Thought Process:  Linear and Logical  Orientation:  Full (Time, Place, and Person)  Thought Content:  Negative  Suicidal Thoughts:  No  Homicidal Thoughts:  No  Memory:  Immediate;   Good Recent;   Good Remote;   Good  Judgement:  Good  Insight:  Good  Psychomotor Activity:  Negative  Concentration:  Good  Recall:  Good  Fund of Knowledge: Good  Language: Good  Akathisia:  Negative  Handed:  Right unknown   AIMS (if indicated):  N/A  Assets:  Communication Skills Desire for Improvement Social Support  ADL's:  Intact  Cognition: WNL  Sleep:  9 hours  With medicine, some sleep onset insomnia   Is the patient at risk to self?  No. Has the patient been a risk to self in the past 6 months?  No. Has the patient been a risk to self within the distant past?  No. Is the patient  a risk to others?  No. Has the patient been a risk to others in the past 6 months?  No. Has the patient been a risk to others within the distant past?  No.  Current Medications: Current Outpatient Prescriptions  Medication Sig Dispense Refill  . amphetamine-dextroamphetamine (ADDERALL) 20 MG tablet Take 1 tablet (20 mg total) by mouth every morning. 30 tablet 0  . cholecalciferol (VITAMIN D) 1000 UNITS tablet Take by mouth.    . cyclobenzaprine (FLEXERIL) 5 MG tablet Take 1 tablet (5 mg total) by mouth at bedtime. 30 tablet 1  . ferrous sulfate (FERROUSUL) 325 (65 FE) MG tablet Take 1 tablet (325 mg total) by mouth daily with breakfast. 90 tablet 1  . mesalamine (LIALDA) 1.2 G EC tablet Take by mouth.    . temazepam (RESTORIL) 15 MG capsule Take 1 capsule (15 mg total) by mouth at bedtime as needed for sleep. 30 capsule 4   No current facility-administered medications for this visit.    Medical Decision Making:  Established Problem, Stable/Improving (1) and Review of New Medication or Change in Dosage (2)  Treatment Plan Summary:Medication management and Plan   ADHD-continue Adderall to 20 mg in the morning.    she has been given a prescription to fill this month as well as one to fill in January. She is aware she'll need to call the clinic to have a prescription for February  Discontinue temazepam to 15 mg at bedtime.  Start trazodone at 50 mg for 2-3 nights and then increase to 100 mg to help with insomnia.  Patient will follow up in 1 months.     Satine Hausner 08/24/2015, 8:34 AM

## 2015-09-03 ENCOUNTER — Telehealth: Payer: Self-pay | Admitting: Family Medicine

## 2015-09-03 DIAGNOSIS — R928 Other abnormal and inconclusive findings on diagnostic imaging of breast: Secondary | ICD-10-CM

## 2015-09-03 NOTE — Telephone Encounter (Signed)
Patient has called office requesting order for mammogram, she states that she had abnormal one last time when a lump was found. Patient states that she was suppose to return back for imaging 6 months from last test. Please Advise/Order. KW

## 2015-09-03 NOTE — Telephone Encounter (Signed)
Ok to order. Thanks.

## 2015-09-09 NOTE — Telephone Encounter (Signed)
Order has been placed. Patient was advised.

## 2015-09-27 ENCOUNTER — Ambulatory Visit: Payer: 59 | Admitting: Psychiatry

## 2015-09-29 ENCOUNTER — Ambulatory Visit (INDEPENDENT_AMBULATORY_CARE_PROVIDER_SITE_OTHER): Payer: 59 | Admitting: Family Medicine

## 2015-09-29 ENCOUNTER — Encounter: Payer: Self-pay | Admitting: Family Medicine

## 2015-09-29 VITALS — BP 102/60 | HR 92 | Temp 98.9°F | Resp 16 | Wt 171.0 lb

## 2015-09-29 DIAGNOSIS — J0110 Acute frontal sinusitis, unspecified: Secondary | ICD-10-CM | POA: Diagnosis not present

## 2015-09-29 DIAGNOSIS — K5150 Left sided colitis without complications: Secondary | ICD-10-CM | POA: Diagnosis not present

## 2015-09-29 DIAGNOSIS — J309 Allergic rhinitis, unspecified: Secondary | ICD-10-CM | POA: Diagnosis not present

## 2015-09-29 DIAGNOSIS — F909 Attention-deficit hyperactivity disorder, unspecified type: Secondary | ICD-10-CM

## 2015-09-29 DIAGNOSIS — F988 Other specified behavioral and emotional disorders with onset usually occurring in childhood and adolescence: Secondary | ICD-10-CM

## 2015-09-29 DIAGNOSIS — J069 Acute upper respiratory infection, unspecified: Secondary | ICD-10-CM

## 2015-09-29 MED ORDER — CETIRIZINE HCL 10 MG PO TABS: 10.0000 mg | ORAL_TABLET | Freq: Every day | ORAL | Status: AC

## 2015-09-29 MED ORDER — FLUTICASONE PROPIONATE 50 MCG/ACT NA SUSP: 2.0000 | Freq: Every day | NASAL | Status: DC

## 2015-09-29 MED ORDER — HYDROCODONE-HOMATROPINE 5-1.5 MG/5ML PO SYRP: 5.0000 mL | ORAL_SOLUTION | Freq: Three times a day (TID) | ORAL | Status: DC | PRN

## 2015-09-29 NOTE — Progress Notes (Signed)
Patient ID: Alison Kramer, female   DOB: Oct 10, 1955, 60 y.o.   MRN: [id number]         Patient: Alison Kramer Female    DOB: 12-03-1955   60 y.o.   MRN: [id number] Visit Date: 09/29/2015  Today's Provider: Margarita Rana, MD   Chief Complaint  Patient presents with  . Sinusitis  . URI   Subjective:    Sinusitis This is a new problem. The current episode started in the past 7 days. The problem has been gradually worsening since onset. There has been no fever. Associated symptoms include congestion, coughing, headaches, sinus pressure, sneezing, a sore throat and swollen glands. Pertinent negatives include no chills, diaphoresis, ear pain or shortness of breath. Past treatments include oral decongestants and acetaminophen. The treatment provided no relief.  URI  This is a new problem. The current episode started in the past 7 days. There has been no fever. Associated symptoms include abdominal pain, congestion, coughing, diarrhea, headaches, a plugged ear sensation, rhinorrhea, sneezing, a sore throat, swollen glands and wheezing. Pertinent negatives include no chest pain, ear pain, nausea or vomiting. She has tried decongestant and NSAIDs for the symptoms. The treatment provided no relief.   Also currently having trouble with her colitis.   Also, wanting to switch her medications and sleep back here since her psychiatrist left.      No Known Allergies Previous Medications   AMPHETAMINE-DEXTROAMPHETAMINE (ADDERALL) 20 MG TABLET    Take 1 tablet (20 mg total) by mouth every morning.   CHOLECALCIFEROL (VITAMIN D) 1000 UNITS TABLET    Take by mouth.   CYCLOBENZAPRINE (FLEXERIL) 5 MG TABLET    Take 1 tablet (5 mg total) by mouth at bedtime.   FERROUS SULFATE (FERROUSUL) 325 (65 FE) MG TABLET    Take 1 tablet (325 mg total) by mouth daily with breakfast.   MESALAMINE (LIALDA) 1.2 G EC TABLET    Take by mouth.   TRAZODONE (DESYREL) 100 MG TABLET    Take 1 tablet (100 mg total) by mouth at  bedtime.    Review of Systems  Constitutional: Positive for fever and fatigue. Negative for chills, diaphoresis, activity change, appetite change and unexpected weight change.  HENT: Positive for congestion, postnasal drip, rhinorrhea, sinus pressure, sneezing, sore throat and voice change. Negative for ear discharge, ear pain, hearing loss, nosebleeds and tinnitus.   Eyes: Positive for pain. Negative for photophobia, redness, itching and visual disturbance.  Respiratory: Positive for cough and wheezing. Negative for apnea, choking, chest tightness and shortness of breath.   Cardiovascular: Negative for chest pain, palpitations and leg swelling.  Gastrointestinal: Positive for abdominal pain and diarrhea. Negative for nausea, vomiting, constipation, blood in stool, abdominal distention, anal bleeding and rectal pain.       Having a flare of Ulcerative colitis.   Neurological: Positive for light-headedness and headaches. Negative for dizziness.    Social History  Substance Use Topics  . Smoking status: Never Smoker   . Smokeless tobacco: Never Used  . Alcohol Use: 1.2 - 4.2 oz/week    0-4 Glasses of wine, 2-3 Shots of liquor, 0 Standard drinks or equivalent, 0 Cans of beer per week   Objective:   BP 102/60 mmHg  Pulse 92  Temp(Src) 98.9 F (37.2 C) (Oral)  Resp 16  Wt 171 lb (77.565 kg)  Physical Exam  Constitutional: She is oriented to person, place, and time. She appears well-developed and well-nourished.  HENT:  Head: Normocephalic and atraumatic.  Right Ear: External ear normal.  Left Ear: External ear normal.  Nose: Mucosal edema present.  Mouth/Throat: Oropharynx is clear and moist.  Cardiovascular: Normal rate, regular rhythm and normal heart sounds.   Pulmonary/Chest: Effort normal and breath sounds normal.  Neurological: She is alert and oriented to person, place, and time.  Psychiatric: She has a normal mood and affect. Her behavior is normal. Judgment and thought  content normal.      Assessment & Plan:     1. Upper respiratory infection Worsening, will start Hycodan; Advised pt to call if worsening or not improved.   - HYDROcodone-homatropine (HYCODAN) 5-1.5 MG/5ML syrup; Take 5 mLs by mouth every 8 (eight) hours as needed for cough.  Dispense: 120 mL; Refill: 0  2. Acute frontal sinusitis, recurrence not specified Suspect Allergic; will treat as below.    3. Allergic rhinitis, unspecified allergic rhinitis type Worsening will try Zyrtec and Flonase.  Advised pt to call if worsening or not improved.   - cetirizine (ZYRTEC) 10 MG tablet; Take 1 tablet (10 mg total) by mouth daily.  Dispense: 90 tablet; Refill: 1 - fluticasone (FLONASE) 50 MCG/ACT nasal spray; Place 2 sprays into both nostrils daily.  Dispense: 16 g; Refill: 5  4. Chronic left-sided ulcerative colitis ([place]) Recent flare; followed by [facility].  Pt will call GI to find out want antibiotic she should take in case her URI does not improve.    5. ADD (attention deficit disorder) Pt has been stable on current medications.  She reports her psychiatrist is leaving and would like to have her prescriptions refilled here.  Pt will make a follow up appointment with Dr. Caryn Section in a few months.   Patient was seen and examined by Jerrell Belfast, MD, and note scribed by Ashley Royalty, CMA.    Margarita Rana, MD  [facility]

## 2015-10-07 ENCOUNTER — Other Ambulatory Visit: Payer: Self-pay | Admitting: Family Medicine

## 2015-10-07 ENCOUNTER — Ambulatory Visit
Admission: RE | Admit: 2015-10-07 | Discharge: 2015-10-07 | Disposition: A | Discharge status: Home / Self Care | Payer: 59 | Source: Ambulatory Visit | Attending: Family Medicine | Admitting: Family Medicine

## 2015-10-07 DIAGNOSIS — R928 Other abnormal and inconclusive findings on diagnostic imaging of breast: Secondary | ICD-10-CM

## 2015-10-12 ENCOUNTER — Encounter: Payer: Self-pay | Admitting: Family Medicine

## 2015-10-12 ENCOUNTER — Ambulatory Visit (INDEPENDENT_AMBULATORY_CARE_PROVIDER_SITE_OTHER): Payer: 59 | Admitting: Family Medicine

## 2015-10-12 VITALS — BP 94/60 | HR 81 | Temp 98.4°F | Resp 16 | Wt 169.0 lb

## 2015-10-12 DIAGNOSIS — F988 Other specified behavioral and emotional disorders with onset usually occurring in childhood and adolescence: Secondary | ICD-10-CM

## 2015-10-12 DIAGNOSIS — F909 Attention-deficit hyperactivity disorder, unspecified type: Secondary | ICD-10-CM | POA: Diagnosis not present

## 2015-10-12 DIAGNOSIS — G4700 Insomnia, unspecified: Secondary | ICD-10-CM | POA: Diagnosis not present

## 2015-10-12 MED ORDER — AMPHETAMINE-DEXTROAMPHETAMINE 20 MG PO TABS: 20.0000 mg | ORAL_TABLET | ORAL | Status: DC

## 2015-10-12 MED ORDER — TRAZODONE HCL 100 MG PO TABS: 100.0000 mg | ORAL_TABLET | Freq: Every day | ORAL | Status: DC

## 2015-10-12 NOTE — Progress Notes (Signed)
Patient: Alison Kramer Female    DOB: 1955/10/13   60 y.o.   MRN: [id number] Visit Date: 10/12/2015  Today's Provider: Lelon Huh, MD   Chief Complaint  Patient presents with  . Transitions Of Care    (former patient of Dr. Venia Minks)  . ADD    follow up   Subjective:    HPI Transitions of Care/ Follow up ADD: New patient to me presents for follow up of ADD. Patients last office visit was 09/29/2015 with Dr. Venia Minks. No changes were made during that visit. Patients Psychiatrist Dr Jimmye Norman was leaving and patient expressed her desire to have prescriptions refilled through her PCP. She has been on current dose Adderall for several years. He main problem she is being treated for is difficulty stay on tasks with work at [employer]. She often does not take medications on her days off. It does have the side effect of keeping her awake at night, for which Dr. Jimmye Norman prescribed trazodone at night. She has found his combination to work well with no other adverse effects.      No Known Allergies Previous Medications   AMPHETAMINE-DEXTROAMPHETAMINE (ADDERALL) 20 MG TABLET    Take 1 tablet (20 mg total) by mouth every morning.   CETIRIZINE (ZYRTEC) 10 MG TABLET    Take 1 tablet (10 mg total) by mouth daily.   CHOLECALCIFEROL (VITAMIN D) 1000 UNITS TABLET    Take by mouth.   FERROUS SULFATE (FERROUSUL) 325 (65 FE) MG TABLET    Take 1 tablet (325 mg total) by mouth daily with breakfast.   FLUTICASONE (FLONASE) 50 MCG/ACT NASAL SPRAY    Place 2 sprays into both nostrils daily.   MESALAMINE (LIALDA) 1.2 G EC TABLET    Take by mouth.   TRAZODONE (DESYREL) 100 MG TABLET    Take 1 tablet (100 mg total) by mouth at bedtime.   Lab Results  Component Value Date   CHOL 168 08/09/2015   HDL 51 08/09/2015   LDLCALC 95 08/09/2015   TRIG 110 08/09/2015   CHOLHDL 3.3 08/09/2015     Review of Systems  Constitutional: Negative for fever, chills, appetite change and fatigue.  Respiratory:  Negative for chest tightness and shortness of breath.   Cardiovascular: Negative for chest pain, palpitations and leg swelling.  Gastrointestinal: Negative for nausea, vomiting and abdominal pain.  Neurological: Negative for dizziness and weakness.    Social History  Substance Use Topics  . Smoking status: Never Smoker   . Smokeless tobacco: Never Used  . Alcohol Use: 1.2 - 4.2 oz/week    0-4 Glasses of wine, 2-3 Shots of liquor, 0 Standard drinks or equivalent, 0 Cans of beer per week     Comment: occasional   Objective:   BP 94/60 mmHg  Pulse 81  Temp(Src) 98.4 F (36.9 C) (Oral)  Resp 16  Wt 169 lb (76.658 kg)  SpO2 98%  Physical Exam   General Appearance:    Alert, cooperative, no distress  Eyes:    PERRL, conjunctiva/corneas clear, EOM's intact       Lungs:     Clear to auscultation bilaterally, respirations unlabored  Heart:    Regular rate and rhythm  Neurologic:   Awake, alert, oriented x 3. No apparent focal neurological           defect.           Assessment & Plan:      1. ADD (attention deficit disorder)  Has done well on current regiment for several years. Continue current medications.   - amphetamine-dextroamphetamine (ADDERALL) 20 MG tablet; Take 1 tablet (20 mg total) by mouth every morning.  Dispense: 30 tablet; Refill: 0  2. Insomnia Adverse effect of stimulant, but well controlled on nighttime trazodone.  - traZODone (DESYREL) 100 MG tablet; Take 1 tablet (100 mg total) by mouth at bedtime.  Dispense: 30 tablet; Refill: 6   Return in about 6 months (around 04/13/2016).       Lelon Huh, MD  [facility]

## 2015-11-15 ENCOUNTER — Other Ambulatory Visit: Payer: Self-pay | Admitting: Family Medicine

## 2015-11-15 DIAGNOSIS — F988 Other specified behavioral and emotional disorders with onset usually occurring in childhood and adolescence: Secondary | ICD-10-CM

## 2015-11-15 NOTE — Telephone Encounter (Signed)
Pt is requesting a refill however, she would like to increas the dose from 20 to 30 MG for amphetamine-dextroamphetamine (ADDERALL).

## 2015-11-15 NOTE — Telephone Encounter (Signed)
Please advise 

## 2015-11-16 MED ORDER — AMPHETAMINE-DEXTROAMPHETAMINE 20 MG PO TABS
20.0000 mg | ORAL_TABLET | ORAL | Status: DC
Start: 2015-11-16 — End: 2015-12-27

## 2015-11-16 NOTE — Telephone Encounter (Signed)
70m is the highest dose I prescribe in people over 50. Rx is ready to pick up.

## 2015-11-16 NOTE — Telephone Encounter (Signed)
Advised pt. Renaldo Fiddler, CMA

## 2015-11-26 ENCOUNTER — Telehealth: Payer: Self-pay | Admitting: Family Medicine

## 2015-11-26 NOTE — Telephone Encounter (Signed)
Returned call to patient. Advised that PA was denied. New form is being faxed to office to resubmit.

## 2015-11-26 NOTE — Telephone Encounter (Signed)
Returned call

## 2015-11-26 NOTE — Telephone Encounter (Signed)
Pt called back stating she just spoke with you and she has more information she needs to tell you.  She would like for you to call her back.

## 2015-11-26 NOTE — Telephone Encounter (Signed)
Pt called to speak to a nurse about prior authorization for Adderall. Pt stated that she is completely out and would like to speak with a nurse today. Thanks TNP

## 2015-11-29 ENCOUNTER — Telehealth: Payer: Self-pay | Admitting: Family Medicine

## 2015-11-29 NOTE — Telephone Encounter (Signed)
Pt returned call about prior authorization on Adderall. Pt stated that she spoke with someone at this # [phone] and was advised that the nurse could call the same # and give info needed over the phone. Pt would like a call back to be advised of where we are with the prior authorization b/c she needs to have this medication filled today. Please advise. Thanks TNP

## 2015-11-29 NOTE — Telephone Encounter (Signed)
Sharyn Lull, do you have an update for this patient? It looks like you were working on her PA.

## 2015-11-30 NOTE — Telephone Encounter (Signed)
Pt is returning your call. Renaldo Fiddler, CMA

## 2015-11-30 NOTE — Telephone Encounter (Signed)
Pt called stating she spoke with [facility] and she did get the prior auth approved

## 2015-11-30 NOTE — Telephone Encounter (Signed)
LMOVM for pt to return call 

## 2015-12-02 NOTE — Telephone Encounter (Signed)
Confirmed rx was approved.

## 2015-12-27 ENCOUNTER — Other Ambulatory Visit: Payer: Self-pay

## 2015-12-27 DIAGNOSIS — F988 Other specified behavioral and emotional disorders with onset usually occurring in childhood and adolescence: Secondary | ICD-10-CM

## 2015-12-27 MED ORDER — AMPHETAMINE-DEXTROAMPHETAMINE 20 MG PO TABS: 20.0000 mg | ORAL_TABLET | ORAL | 0 refills | Status: DC

## 2015-12-27 NOTE — Telephone Encounter (Signed)
patient called requesting refill for Adderall. Please review-aa

## 2016-01-12 ENCOUNTER — Encounter: Payer: Self-pay | Admitting: Physician Assistant

## 2016-01-12 ENCOUNTER — Ambulatory Visit (INDEPENDENT_AMBULATORY_CARE_PROVIDER_SITE_OTHER): Payer: 59 | Admitting: Physician Assistant

## 2016-01-12 VITALS — BP 102/62 | HR 90 | Temp 98.2°F | Resp 16 | Wt 163.0 lb

## 2016-01-12 DIAGNOSIS — G43119 Migraine with aura, intractable, without status migrainosus: Secondary | ICD-10-CM | POA: Diagnosis not present

## 2016-01-12 MED ORDER — SUMATRIPTAN SUCCINATE 50 MG PO TABS: 50.0000 mg | ORAL_TABLET | Freq: Once | ORAL | 0 refills | Status: DC

## 2016-01-12 NOTE — Patient Instructions (Signed)
Sumatriptan tablets What is this medicine? SUMATRIPTAN (soo ma TRIP tan) is used to treat migraines with or without aura. An aura is a strange feeling or visual disturbance that warns you of an attack. It is not used to prevent migraines. This medicine may be used for other purposes; ask your health care provider or pharmacist if you have questions. What should I tell my health care provider before I take this medicine? They need to know if you have any of these conditions: -circulation problems in fingers and toes -diabetes -heart disease -high blood pressure -high cholesterol -history of irregular heartbeat -history of stroke -kidney disease -liver disease -postmenopausal or surgical removal of uterus and ovaries -seizures -smoke tobacco -stomach or intestine problems -an unusual or allergic reaction to sumatriptan, other medicines, foods, dyes, or preservatives -pregnant or trying to get pregnant -breast-feeding How should I use this medicine? Take this medicine by mouth with a glass of water. Follow the directions on the prescription label. This medicine is taken at the first symptoms of a migraine. It is not for everyday use. If your migraine headache returns after one dose, you can take another dose as directed. You must leave at least 2 hours between doses, and do not take more than 100 mg as a single dose. Do not take more than 200 mg total in any 24 hour period. If there is no improvement at all after the first dose, do not take a second dose without talking to your doctor or health care professional. Do not take your medicine more often than directed. Talk to your pediatrician regarding the use of this medicine in children. Special care may be needed. Overdosage: If you think you have taken too much of this medicine contact a poison control center or emergency room at once. NOTE: This medicine is only for you. Do not share this medicine with others. What if I miss a dose? This  does not apply; this medicine is not for regular use. What may interact with this medicine? Do not take this medicine with any of the following medicines: -cocaine -ergot alkaloids like dihydroergotamine, ergonovine, ergotamine, methylergonovine -feverfew -MAOIs like Carbex, Eldepryl, Marplan, Nardil, and Parnate -other medicines for migraine headache like almotriptan, eletriptan, frovatriptan, naratriptan, rizatriptan, zolmitriptan -tryptophan This medicine may also interact with the following medications: -certain medicines for depression, anxiety, or psychotic disturbances This list may not describe all possible interactions. Give your health care provider a list of all the medicines, herbs, non-prescription drugs, or dietary supplements you use. Also tell them if you smoke, drink alcohol, or use illegal drugs. Some items may interact with your medicine. What should I watch for while using this medicine? Only take this medicine for a migraine headache. Take it if you get warning symptoms or at the start of a migraine attack. It is not for regular use to prevent migraine attacks. You may get drowsy or dizzy. Do not drive, use machinery, or do anything that needs mental alertness until you know how this medicine affects you. To reduce dizzy or fainting spells, do not sit or stand up quickly, especially if you are an older patient. Alcohol can increase drowsiness, dizziness and flushing. Avoid alcoholic drinks. Smoking cigarettes may increase the risk of heart-related side effects from using this medicine. If you take migraine medicines for 10 or more days a month, your migraines may get worse. Keep a diary of headache days and medicine use. Contact your healthcare professional if your migraine attacks occur more frequently. What  side effects may I notice from receiving this medicine? Side effects that you should report to your doctor or health care professional as soon as possible: -allergic  reactions like skin rash, itching or hives, swelling of the face, lips, or tongue -bloody or watery diarrhea -hallucination, loss of contact with reality -pain, tingling, numbness in the face, hands, or feet -seizures -signs and symptoms of a blood clot such as breathing problems; changes in vision; chest pain; severe, sudden headache; pain, swelling, warmth in the leg; trouble speaking; sudden numbness or weakness of the face, arm, or leg -signs and symptoms of a dangerous change in heartbeat or heart rhythm like chest pain; dizziness; fast or irregular heartbeat; palpitations, feeling faint or lightheaded; falls; breathing problems -signs and symptoms of a stroke like changes in vision; confusion; trouble speaking or understanding; severe headaches; sudden numbness or weakness of the face, arm, or leg; trouble walking; dizziness; loss of balance or coordination -stomach pain Side effects that usually do not require medical attention (report these to your doctor or health care professional if they continue or are bothersome): -changes in taste -facial flushing -headache -muscle cramps -muscle pain -nausea, vomiting -weak or tired This list may not describe all possible side effects. Call your doctor for medical advice about side effects. You may report side effects to FDA at 1-800-FDA-1088. Where should I keep my medicine? Keep out of the reach of children. Store at room temperature between 2 and 30 degrees C (36 and 86 degrees F). Throw away any unused medicine after the expiration date. NOTE: This sheet is a summary. It may not cover all possible information. If you have questions about this medicine, talk to your doctor, pharmacist, or health care provider.    2016, Elsevier/Gold Standard. (2014-11-26 17:46:40) Migraine Headache A migraine headache is an intense, throbbing pain on one or both sides of your head. A migraine can last for 30 minutes to several hours. CAUSES  The exact  cause of a migraine headache is not always known. However, a migraine may be caused when nerves in the brain become irritated and release chemicals that cause inflammation. This causes pain. Certain things may also trigger migraines, such as:  Alcohol.  Smoking.  Stress.  Menstruation.  Aged cheeses.  Foods or drinks that contain nitrates, glutamate, aspartame, or tyramine.  Lack of sleep.  Chocolate.  Caffeine.  Hunger.  Physical exertion.  Fatigue.  Medicines used to treat chest pain (nitroglycerine), birth control pills, estrogen, and some blood pressure medicines. SIGNS AND SYMPTOMS  Pain on one or both sides of your head.  Pulsating or throbbing pain.  Severe pain that prevents daily activities.  Pain that is aggravated by any physical activity.  Nausea, vomiting, or both.  Dizziness.  Pain with exposure to bright lights, loud noises, or activity.  General sensitivity to bright lights, loud noises, or smells. Before you get a migraine, you may get warning signs that a migraine is coming (aura). An aura may include:  Seeing flashing lights.  Seeing bright spots, halos, or zigzag lines.  Having tunnel vision or blurred vision.  Having feelings of numbness or tingling.  Having trouble talking.  Having muscle weakness. DIAGNOSIS  A migraine headache is often diagnosed based on:  Symptoms.  Physical exam.  A CT scan or MRI of your head. These imaging tests cannot diagnose migraines, but they can help rule out other causes of headaches. TREATMENT Medicines may be given for pain and nausea. Medicines can also be given  to help prevent recurrent migraines.  HOME CARE INSTRUCTIONS  Only take over-the-counter or prescription medicines for pain or discomfort as directed by your health care provider. The use of long-term narcotics is not recommended.  Lie down in a dark, quiet room when you have a migraine.  Keep a journal to find out what may trigger  your migraine headaches. For example, write down:  What you eat and drink.  How much sleep you get.  Any change to your diet or medicines.  Limit alcohol consumption.  Quit smoking if you smoke.  Get 7-9 hours of sleep, or as recommended by your health care provider.  Limit stress.  Keep lights dim if bright lights bother you and make your migraines worse. SEEK IMMEDIATE MEDICAL CARE IF:   Your migraine becomes severe.  You have a fever.  You have a stiff neck.  You have vision loss.  You have muscular weakness or loss of muscle control.  You start losing your balance or have trouble walking.  You feel faint or pass out.  You have severe symptoms that are different from your first symptoms. MAKE SURE YOU:   Understand these instructions.  Will watch your condition.  Will get help right away if you are not doing well or get worse.   This information is not intended to replace advice given to you by your health care provider. Make sure you discuss any questions you have with your health care provider.   Document Released: 05/22/2005 Document Revised: 06/12/2014 Document Reviewed: 01/27/2013 Elsevier Interactive Patient Education [employer].

## 2016-01-12 NOTE — Progress Notes (Signed)
Patient: Alison Kramer Female    DOB: 1956/05/23   60 y.o.   MRN: [id number] Visit Date: 01/12/2016  Today's Provider: Mar Daring, PA-C   Chief Complaint  Patient presents with  . Headache   Subjective:    Headache   This is a new problem. The current episode started in the past 7 days. The problem occurs intermittently. The problem has been waxing and waning. The pain is located in the temporal and retro-orbital (It start on temporal and radiates all over her head) region. The pain radiates to the left neck, right neck and face. The quality of the pain is described as aching, squeezing and stabbing (when she moves her eyes a stabbing on her temporal). The pain is at a severity of 4/10. Associated symptoms include blurred vision (occasional), coughing (occasional), eye pain (left yesterday), photophobia, sinus pressure and a sore throat (2 weeks ago; none now). Pertinent negatives include no abdominal pain, dizziness, ear pain, fever, nausea, numbness, tinnitus or weakness. Associated symptoms comments: lightheadedness. Nothing aggravates the symptoms. She has tried acetaminophen (Took Ibuprofen. and Sudafed yesterday) for the symptoms. The treatment provided no relief.   She states this headache is similar to previous migraines. She does have a history of migraines, but states she has not had any in many years. On Wednesday of last week she reports noticing her visual aura that was common with previous migraines. She is under more stress at work due to being understaffed. She also recently started a medical weight loss program as well and has drastically changed her diet. Ibuprofen has worked in the past for her migraines, and she did take one on Sunday that did relief her migraine that day. She is to not take many NSAIDs due to GI issues. She has been trying tylenol without relief. She has used triptans in the past but cannot remember which ones as it has been 20 years or more since  she needed them.     No Known Allergies Current Meds  Medication Sig  . amphetamine-dextroamphetamine (ADDERALL) 20 MG tablet Take 1 tablet (20 mg total) by mouth every morning.  . cetirizine (ZYRTEC) 10 MG tablet Take 1 tablet (10 mg total) by mouth daily.  . cholecalciferol (VITAMIN D) 1000 UNITS tablet Take by mouth.  . fluticasone (FLONASE) 50 MCG/ACT nasal spray Place 2 sprays into both nostrils daily.  . mesalamine (LIALDA) 1.2 G EC tablet Take by mouth.  . prednisoLONE sodium phosphate (INFLAMASE FORTE) 1 % ophthalmic solution Place 1 drop into both eyes every other day.  . traZODone (DESYREL) 100 MG tablet Take 1 tablet (100 mg total) by mouth at bedtime.    Review of Systems  Constitutional: Positive for appetite change and fatigue. Negative for chills and fever.  HENT: Positive for postnasal drip, sinus pressure and sore throat (2 weeks ago; none now). Negative for ear pain, tinnitus and trouble swallowing.   Eyes: Positive for blurred vision (occasional), photophobia and pain (left yesterday).  Respiratory: Positive for cough (occasional). Negative for chest tightness, shortness of breath and wheezing.   Cardiovascular: Negative for chest pain, palpitations and leg swelling.  Gastrointestinal: Negative for abdominal pain and nausea.  Neurological: Positive for headaches. Negative for dizziness, weakness and numbness.    Social History  Substance Use Topics  . Smoking status: Never Smoker  . Smokeless tobacco: Never Used  . Alcohol use 1.2 - 4.2 oz/week    2 - 3 Shots of liquor  per week     Comment: occasional   Objective:   BP 102/62 (BP Location: Left Arm, Patient Position: Sitting, Cuff Size: Normal)   Pulse 90   Temp 98.2 F (36.8 C) (Oral)   Resp 16   Wt 163 lb (73.9 kg)   BMI 27.98 kg/m   Physical Exam  Constitutional: She is oriented to person, place, and time. She appears well-developed and well-nourished. No distress.  HENT:  Head: Normocephalic and  atraumatic.  Right Ear: Hearing, tympanic membrane, external ear and ear canal normal.  Left Ear: Hearing, tympanic membrane, external ear and ear canal normal.  Nose: Right sinus exhibits no maxillary sinus tenderness and no frontal sinus tenderness. Left sinus exhibits maxillary sinus tenderness (pressure). Left sinus exhibits no frontal sinus tenderness.  Mouth/Throat: Uvula is midline, oropharynx is clear and moist and mucous membranes are normal. No oropharyngeal exudate (postnasal mucous drainage noted), posterior oropharyngeal edema or posterior oropharyngeal erythema.  Eyes: Conjunctivae and EOM are normal. Pupils are equal, round, and reactive to light. Right eye exhibits no discharge. Left eye exhibits no discharge.  Neck: Normal range of motion. Neck supple. No JVD present. No tracheal deviation present. No Brudzinski's sign and no Kernig's sign noted. No thyromegaly present.  Cardiovascular: Normal rate, regular rhythm and normal heart sounds.  Exam reveals no gallop and no friction rub.   No murmur heard. Pulmonary/Chest: Effort normal and breath sounds normal. No stridor. No respiratory distress. She has no wheezes. She has no rales. She exhibits no tenderness.  Lymphadenopathy:    She has no cervical adenopathy.  Neurological: She is alert and oriented to person, place, and time. No cranial nerve deficit. Coordination normal.  Skin: Skin is warm and dry.  Psychiatric: She has a normal mood and affect. Her behavior is normal. Judgment and thought content normal.  Vitals reviewed.     Assessment & Plan:     1. Intractable migraine with aura without status migrainosus Due to her not being able to take IBU, or other NSAIDs, we will start with Imitrex as below for relief of the migraine. If the migraine does not subside, or if it worsens, she is to call the office. Also if she gets relief from this one but has more than 4 new migraines in a month she is to call and we may begin a daily  medication. She may also consider starting a headache journal to look for triggers that may come from her new diet.  - SUMAtriptan (IMITREX) 50 MG tablet; Take 1 tablet (50 mg total) by mouth once. Repeat in 2 hours if headache persists or recurs. For a total of 152m in 24hrs  Dispense: 10 tablet; Refill: 0       JMar Daring PA-C  [facility]

## 2016-01-31 ENCOUNTER — Other Ambulatory Visit: Payer: Self-pay | Admitting: Family Medicine

## 2016-01-31 DIAGNOSIS — F988 Other specified behavioral and emotional disorders with onset usually occurring in childhood and adolescence: Secondary | ICD-10-CM

## 2016-01-31 MED ORDER — AMPHETAMINE-DEXTROAMPHETAMINE 20 MG PO TABS: 20.0000 mg | ORAL_TABLET | ORAL | 0 refills | Status: DC

## 2016-01-31 NOTE — Telephone Encounter (Signed)
Last ov for ADD 10/12/15

## 2016-01-31 NOTE — Telephone Encounter (Signed)
Pt contacted office for refill request on the following medications:  amphetamine-dextroamphetamine (ADDERALL) 20 MG tablet.  DK#[phone]/[id number]

## 2016-03-02 ENCOUNTER — Other Ambulatory Visit: Payer: Self-pay | Admitting: Family Medicine

## 2016-03-02 DIAGNOSIS — F988 Other specified behavioral and emotional disorders with onset usually occurring in childhood and adolescence: Secondary | ICD-10-CM

## 2016-03-02 MED ORDER — AMPHETAMINE-DEXTROAMPHETAMINE 20 MG PO TABS: 20.0000 mg | ORAL_TABLET | ORAL | 0 refills | Status: DC

## 2016-03-02 NOTE — Telephone Encounter (Signed)
Refill request for Adderall 20 mg qd Last filled by MD on- 01/31/16 #30 Last Appt: 10/12/2015 Next Appt: 04/14/2016 Please advise refill?

## 2016-03-02 NOTE — Telephone Encounter (Signed)
Please notify patient Rx printed and will be up front for pick up

## 2016-03-02 NOTE — Telephone Encounter (Signed)
Pt contacted office for refill request on the following medications:  amphetamine-dextroamphetamine (ADDERALL) 20 MG tablet.  EB#[phone]/[id number]

## 2016-03-02 NOTE — Telephone Encounter (Signed)
Patient was notified.

## 2016-04-03 ENCOUNTER — Other Ambulatory Visit: Payer: Self-pay | Admitting: Family Medicine

## 2016-04-03 DIAGNOSIS — F988 Other specified behavioral and emotional disorders with onset usually occurring in childhood and adolescence: Secondary | ICD-10-CM

## 2016-04-03 MED ORDER — AMPHETAMINE-DEXTROAMPHETAMINE 20 MG PO TABS: 20.0000 mg | ORAL_TABLET | ORAL | 0 refills | Status: DC

## 2016-04-03 NOTE — Telephone Encounter (Signed)
Please review. Thanks!  

## 2016-04-03 NOTE — Telephone Encounter (Signed)
Pt contacted office for refill request on the following medications:  amphetamine-dextroamphetamine (ADDERALL) 20 MG tablet.  HK#[phone]/[id number]

## 2016-04-14 ENCOUNTER — Encounter: Payer: Self-pay | Admitting: Family Medicine

## 2016-04-14 ENCOUNTER — Ambulatory Visit (INDEPENDENT_AMBULATORY_CARE_PROVIDER_SITE_OTHER): Payer: 59 | Admitting: Family Medicine

## 2016-04-14 ENCOUNTER — Ambulatory Visit: Payer: Self-pay | Admitting: Family Medicine

## 2016-04-14 VITALS — BP 108/60 | HR 85 | Temp 98.2°F | Resp 16 | Wt 157.0 lb

## 2016-04-14 DIAGNOSIS — F988 Other specified behavioral and emotional disorders with onset usually occurring in childhood and adolescence: Secondary | ICD-10-CM | POA: Diagnosis not present

## 2016-04-14 DIAGNOSIS — G4700 Insomnia, unspecified: Secondary | ICD-10-CM | POA: Diagnosis not present

## 2016-04-14 MED ORDER — AMPHETAMINE-DEXTROAMPHETAMINE 20 MG PO TABS: 20.0000 mg | ORAL_TABLET | ORAL | 0 refills | Status: DC

## 2016-04-14 NOTE — Progress Notes (Signed)
Patient: Alison Kramer Female    DOB: 02/04/1956   60 y.o.   MRN: [id number] Visit Date: 04/14/2016  Today's Provider: Lelon Huh, MD   Chief Complaint  Patient presents with  . ADD    follow up  . Insomnia    follow up   Subjective:    HPI Follow up ADD:  Patient was last seen for this problem 6 months ago and no changes were made. Patient reports good compliance with treatment, good tolerance and good symptom control.   Follow up Insomnia:  Patient was last seen 6 months ago for this problem and no changes were made.  Patient reports good compliance with treatment, good tolerance and good symptom control.  Patient sleeps an average of 6-7 hours each night.      No Known Allergies   Current Outpatient Prescriptions:  .  amphetamine-dextroamphetamine (ADDERALL) 20 MG tablet, Take 1 tablet (20 mg total) by mouth every morning., Disp: 30 tablet, Rfl: 0 .  cetirizine (ZYRTEC) 10 MG tablet, Take 1 tablet (10 mg total) by mouth daily., Disp: 90 tablet, Rfl: 1 .  cholecalciferol (VITAMIN D) 1000 UNITS tablet, Take by mouth., Disp: , Rfl:  .  fluticasone (FLONASE) 50 MCG/ACT nasal spray, Place 2 sprays into both nostrils daily., Disp: 16 g, Rfl: 5 .  mesalamine (LIALDA) 1.2 G EC tablet, Take by mouth., Disp: , Rfl:  .  prednisoLONE sodium phosphate (INFLAMASE FORTE) 1 % ophthalmic solution, Place 1 drop into both eyes every other day., Disp: , Rfl:  .  traZODone (DESYREL) 100 MG tablet, Take 1 tablet (100 mg total) by mouth at bedtime., Disp: 30 tablet, Rfl: 6 .  SUMAtriptan (IMITREX) 50 MG tablet, Take 1 tablet (50 mg total) by mouth once. Repeat in 2 hours if headache persists or recurs. For a total of 150m in 24hrs, Disp: 10 tablet, Rfl: 0  Review of Systems  Constitutional: Negative for appetite change, chills, fatigue and fever.  Respiratory: Negative for chest tightness and shortness of breath.   Cardiovascular: Negative for chest pain and palpitations.    Gastrointestinal: Negative for abdominal pain, nausea and vomiting.  Neurological: Negative for dizziness and weakness.    Social History  Substance Use Topics  . Smoking status: Never Smoker  . Smokeless tobacco: Never Used  . Alcohol use 1.2 - 4.2 oz/week    2 - 3 Shots of liquor per week     Comment: occasional   Objective:   BP 108/60 (BP Location: Left Arm, Patient Position: Sitting, Cuff Size: Normal)   Pulse 85   Temp 98.2 F (36.8 C) (Oral)   Resp 16   Wt 157 lb (71.2 kg)   SpO2 97% Comment: room air  BMI 26.95 kg/m   Physical Exam   General Appearance:    Alert, cooperative, no distress  Eyes:    PERRL, conjunctiva/corneas clear, EOM's intact       Lungs:     Clear to auscultation bilaterally, respirations unlabored  Heart:    Regular rate and rhythm  Neurologic:   Awake, alert, oriented x 3. No apparent focal neurological           defect.           Assessment & Plan:     1. Attention deficit disorder, unspecified hyperactivity presence Doing well on current medications.  - amphetamine-dextroamphetamine (ADDERALL) 20 MG tablet; Take 1 tablet (20 mg total) by mouth every morning. May fill on  or after 06-03-2016  Dispense: 30 tablet; Refill: 0  2. Insomnia, unspecified type Stable on current dose of trazodone.   Getting flu shot at work.      The entirety of the information documented in the History of Present Illness, Review of Systems and Physical Exam were personally obtained by me. Portions of this information were initially documented by Meyer Cory, CMA and reviewed by me for thoroughness and accuracy.    Lelon Huh, MD  [facility]

## 2016-07-03 ENCOUNTER — Other Ambulatory Visit: Payer: Self-pay | Admitting: Family Medicine

## 2016-07-03 DIAGNOSIS — F988 Other specified behavioral and emotional disorders with onset usually occurring in childhood and adolescence: Secondary | ICD-10-CM

## 2016-07-03 MED ORDER — AMPHETAMINE-DEXTROAMPHETAMINE 20 MG PO TABS: 20.0000 mg | ORAL_TABLET | ORAL | 0 refills | Status: DC

## 2016-07-03 NOTE — Telephone Encounter (Signed)
Please review. Thanks!  

## 2016-07-03 NOTE — Telephone Encounter (Signed)
Pt contacted office for refill request on the following medications:  amphetamine-dextroamphetamine (ADDERALL) 20 MG tablet.  HL#[phone]/TD

## 2016-07-05 ENCOUNTER — Other Ambulatory Visit: Payer: Self-pay | Admitting: Family Medicine

## 2016-07-05 DIAGNOSIS — G4700 Insomnia, unspecified: Secondary | ICD-10-CM

## 2016-08-08 ENCOUNTER — Other Ambulatory Visit: Payer: Self-pay

## 2016-08-08 DIAGNOSIS — F988 Other specified behavioral and emotional disorders with onset usually occurring in childhood and adolescence: Secondary | ICD-10-CM

## 2016-08-08 NOTE — Telephone Encounter (Signed)
Pt called requesting refill on Adderall. Is requesting 3 months. Renaldo Fiddler, CMA

## 2016-08-09 ENCOUNTER — Other Ambulatory Visit: Payer: Self-pay

## 2016-08-09 DIAGNOSIS — F988 Other specified behavioral and emotional disorders with onset usually occurring in childhood and adolescence: Secondary | ICD-10-CM

## 2016-08-09 MED ORDER — AMPHETAMINE-DEXTROAMPHETAMINE 20 MG PO TABS: 20.0000 mg | ORAL_TABLET | ORAL | 0 refills | Status: DC

## 2016-09-08 ENCOUNTER — Other Ambulatory Visit: Payer: Self-pay | Admitting: Family Medicine

## 2016-09-08 DIAGNOSIS — F988 Other specified behavioral and emotional disorders with onset usually occurring in childhood and adolescence: Secondary | ICD-10-CM

## 2016-09-08 MED ORDER — AMPHETAMINE-DEXTROAMPHETAMINE 20 MG PO TABS: 20.0000 mg | ORAL_TABLET | ORAL | 0 refills | Status: DC

## 2016-09-08 NOTE — Telephone Encounter (Signed)
Only giving one month since Dr. Caryn Section patient and since he is out of the office

## 2016-09-08 NOTE — Telephone Encounter (Signed)
Pt requesting a refill of amphetamine-dextroamphetamine (ADDERALL) 20 MG tablet for 3 mths supply.  Pt will be out on Monday.

## 2016-09-08 NOTE — Telephone Encounter (Signed)
LOV 04/14/2016. Last refill 08/09/2016. Dr. Maralyn Sago pt. Alison Kramer, CMA

## 2016-09-08 NOTE — Telephone Encounter (Signed)
Pt advised. Renaldo Fiddler, CMA

## 2016-09-08 NOTE — Telephone Encounter (Signed)
LM on voicemail to advise pt Rx is ready for pick up. Thanks TNP

## 2016-10-09 ENCOUNTER — Other Ambulatory Visit: Payer: Self-pay | Admitting: Family Medicine

## 2016-10-09 DIAGNOSIS — F988 Other specified behavioral and emotional disorders with onset usually occurring in childhood and adolescence: Secondary | ICD-10-CM

## 2016-10-09 MED ORDER — AMPHETAMINE-DEXTROAMPHETAMINE 20 MG PO TABS: 20.0000 mg | ORAL_TABLET | ORAL | 0 refills | Status: DC

## 2016-10-09 NOTE — Telephone Encounter (Signed)
Pt needs refill on her  amphetamine-dextroamphetamine (ADDERALL) 20 MG tablet  Thanks Con Memos

## 2016-10-10 ENCOUNTER — Encounter: Payer: Self-pay | Admitting: Family Medicine

## 2016-10-10 ENCOUNTER — Ambulatory Visit (INDEPENDENT_AMBULATORY_CARE_PROVIDER_SITE_OTHER): Payer: 59 | Admitting: Family Medicine

## 2016-10-10 VITALS — BP 108/70 | HR 94 | Temp 98.0°F | Resp 18 | Ht 63.5 in | Wt 170.0 lb

## 2016-10-10 DIAGNOSIS — F988 Other specified behavioral and emotional disorders with onset usually occurring in childhood and adolescence: Secondary | ICD-10-CM | POA: Diagnosis not present

## 2016-10-10 DIAGNOSIS — D509 Iron deficiency anemia, unspecified: Secondary | ICD-10-CM | POA: Diagnosis not present

## 2016-10-10 DIAGNOSIS — Z0000 Encounter for general adult medical examination without abnormal findings: Secondary | ICD-10-CM | POA: Diagnosis not present

## 2016-10-10 MED ORDER — AMPHETAMINE-DEXTROAMPHETAMINE 20 MG PO TABS: 20.0000 mg | ORAL_TABLET | ORAL | 0 refills | Status: DC

## 2016-10-10 NOTE — Progress Notes (Signed)
Patient: Alison Kramer, Female    DOB: [birth year], 61 y.o.   MRN: [id number] Visit Date: 10/10/2016  Today's Provider: Lelon Huh, MD   Chief Complaint  Patient presents with  . Annual Exam  . ADD    follow up  . Insomnia    follow up   Subjective:    Annual physical exam Alison Kramer is a 61 y.o. female who presents today for health maintenance and complete physical. She feels fairly well. She reports exercising 2 times a week. She reports she is sleeping fairly well.  ----------------------------------------------------------------- Follow up of ADD:  Patient was last seen for this problem 6 months ago and no changes were made. Patient reports good compliance with treatment, good tolerance and good symptom control.   Follow up of Insomnia:  Patient was last seen for this problem 6 months ago and no changes were made. Patient reports good compliance with treatment, good tolerance and good symptom control.   Review of Systems  Constitutional: Negative for chills, fatigue and fever.  HENT: Positive for congestion, mouth sores, postnasal drip, rhinorrhea, sinus pain and sneezing. Negative for ear pain and sore throat.   Eyes: Negative.  Negative for pain and redness.  Respiratory: Positive for cough. Negative for shortness of breath and wheezing.   Cardiovascular: Negative for chest pain and leg swelling.  Gastrointestinal: Positive for anal bleeding. Negative for abdominal pain, blood in stool, constipation, diarrhea and nausea.  Endocrine: Negative for polydipsia and polyphagia.  Genitourinary: Negative.  Negative for dysuria, flank pain, hematuria, pelvic pain, vaginal bleeding and vaginal discharge.  Musculoskeletal: Positive for neck pain and neck stiffness. Negative for arthralgias, back pain, gait problem and joint swelling.  Skin: Negative for rash.  Allergic/Immunologic: Positive for environmental allergies.  Neurological: Positive for headaches. Negative  for dizziness, tremors, seizures, weakness, light-headedness and numbness.  Hematological: Negative for adenopathy.  Psychiatric/Behavioral: Positive for sleep disturbance. Negative for behavioral problems, confusion and dysphoric mood. The patient is not nervous/anxious and is not hyperactive.     Social History      She  reports that she has never smoked. She has never used smokeless tobacco. She reports that she drinks about 1.2 - 4.2 oz of alcohol per week . She reports that she does not use drugs.       Social History   Social History  . Marital status: Married    Spouse name: N/A  . Number of children: N/A  . Years of education: N/A   Occupational History  . 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 Smoking status: Never Smoker  . Smokeless tobacco: Never Used  . Alcohol use 1.2 - 4.2 oz/week    2 - 3 Shots of liquor per week     Comment: occasional  . Drug use: No  . Sexual activity: Yes    Birth control/ protection: None   Other Topics Concern  . None   Social History Narrative  . None    Past Medical History:  Diagnosis Date  . ADHD (attention deficit hyperactivity disorder)   . Anxiety   . Depression      Patient Active Problem List   Diagnosis Date Noted  . Insomnia 10/12/2015  . Allergic rhinitis 09/29/2015  . ADD (attention deficit disorder) 12/10/2014  . Chronic left-sided ulcerative colitis ([place]) 11/22/2011  . Acute or subacute iridocyclitis 03/01/2011  . Colitis gravis ([place]) 03/01/2011    Past Surgical History:  Procedure Laterality Date  . ABDOMINAL HYSTERECTOMY    . BACK SURGERY    . BUNIONECTOMY      Family History        Family Status  Relation Status  . Mother Deceased  . Father Deceased at age 59   Last 04-13-15 From Kidney failure and heart failure  . Brother Alive  . Brother Alive  . Brother Alive  . Brother Alive        Her family history includes Breast cancer in her mother; CAD in her father;  Diabetes in her brother; Heart Problems in her brother and father; Hyperlipidemia in her mother; Hypertension in her brother; Obesity in her brother; Pancreatic disease in her father.     No Known Allergies   Current Outpatient Prescriptions:  .  amphetamine-dextroamphetamine (ADDERALL) 20 MG tablet, Take 1 tablet (20 mg total) by mouth every morning., Disp: 30 tablet, Rfl: 0 .  cetirizine (ZYRTEC) 10 MG tablet, Take 1 tablet (10 mg total) by mouth daily., Disp: 90 tablet, Rfl: 1 .  cholecalciferol (VITAMIN D) 1000 UNITS tablet, Take by mouth., Disp: , Rfl:  .  fluticasone (FLONASE) 50 MCG/ACT nasal spray, Place 2 sprays into both nostrils daily., Disp: 16 g, Rfl: 5 .  mesalamine (LIALDA) 1.2 G EC tablet, Take by mouth., Disp: , Rfl:  .  prednisoLONE sodium phosphate (INFLAMASE FORTE) 1 % ophthalmic solution, Place 1 drop into both eyes every other day., Disp: , Rfl:  .  traZODone (DESYREL) 100 MG tablet, TAKE ONE TABLET BY MOUTH AT BEDTIME, Disp: 30 tablet, Rfl: 6 .  SUMAtriptan (IMITREX) 50 MG tablet, Take 1 tablet (50 mg total) by mouth once. Repeat in 2 hours if headache persists or recurs. For a total of 126m in 24hrs, Disp: 10 tablet, Rfl: 0   Patient Care Team: FBirdie Sons MD as PCP - General (Family Medicine)      Objective:   Vitals: BP 108/70 (BP Location: Left Arm, Patient Position: Sitting, Cuff Size: Large)   Pulse 94   Temp 98 F (36.7 C) (Oral)   Resp 18   Ht 5' 3.5" (1.613 m)   Wt 170 lb (77.1 kg)   SpO2 98% Comment: room air  BMI 29.64 kg/m   There were no vitals filed for this visit.   Physical Exam  General Appearance:    Alert, cooperative, no distress, appears stated age  Head:    Normocephalic, without obvious abnormality, atraumatic  Eyes:    PERRL, conjunctiva/corneas clear, EOM's intact, fundi    benign, both eyes  Ears:    Normal TM's and external ear canals, both ears  Nose:   Nares normal, septum midline, mucosa normal, no drainage    or  sinus tenderness  Throat:   Lips, mucosa, and tongue normal; teeth and gums normal  Neck:   Supple, symmetrical, trachea midline, no adenopathy;    thyroid:  no enlargement/tenderness/nodules; no carotid   bruit or JVD  Back:     Symmetric, no curvature, ROM normal, no CVA tenderness  Lungs:     Clear to auscultation bilaterally, respirations unlabored  Chest Wall:    No tenderness or deformity   Heart:    Regular rate and rhythm, S1 and S2 normal, no murmur, rub   or gallop  Breast Exam:    normal appearance, no masses or tenderness  Abdomen:     Soft, non-tender, bowel sounds active all four quadrants,    no masses, no organomegaly  Pelvic:    deferred  Extremities:   Extremities normal, atraumatic, no cyanosis or edema  Pulses:   2+ and symmetric all extremities  Skin:   Skin color, texture, turgor normal, no rashes or lesions  Lymph nodes:   Cervical, supraclavicular, and axillary nodes normal  Neurologic:   CNII-XII intact, normal strength, sensation and reflexes    throughout    Depression Screen PHQ 2/9 Scores 10/10/2016  PHQ - 2 Score 0  PHQ- 9 Score 3      Assessment & Plan:     Routine Health Maintenance and Physical Exam  Exercise Activities and Dietary recommendations Goals    None      Immunization History  Administered Date(s) Administered  . Influenza,inj,Quad PF,36+ Mos 04/12/2015  . Td 02/14/2008  . Tdap 02/14/2008  . Zoster 05/12/2011    Health Maintenance  Topic Date Due  . INFLUENZA VACCINE  01/03/2017  . MAMMOGRAM  04/06/2017  . TETANUS/TDAP  02/13/2018  . COLONOSCOPY  08/06/2024  . Hepatitis C Screening  Completed  . HIV Screening  Completed     Discussed health benefits of physical activity, and encouraged her to engage in regular exercise appropriate for her age and condition.    --------------------------------------------------------------------  1. Annual physical exam  - Comprehensive Metabolic Panel (CMET)  2. Attention  deficit disorder, unspecified hyperactivity presence Doing well on current dose of Adderall. Continue current medications.   - amphetamine-dextroamphetamine (ADDERALL) 20 MG tablet; Take 1 tablet (20 mg total) by mouth every morning.  Dispense: 30 tablet; Refill: 0  3. Iron deficiency anemia, unspecified iron deficiency anemia type Has not been checked for a few years and is not on iron supplements.  - CBC - Ferritin   Lelon Huh, MD  [facility]

## 2016-10-10 NOTE — Patient Instructions (Signed)

## 2016-10-11 ENCOUNTER — Telehealth: Payer: Self-pay

## 2016-10-11 LAB — CBC
HEMATOCRIT: 38.7 % (ref 34.0–46.6)
HEMOGLOBIN: 12.6 g/dL (ref 11.1–15.9)
MCH: 31 pg (ref 26.6–33.0)
MCHC: 32.6 g/dL (ref 31.5–35.7)
MCV: 95 fL (ref 79–97)
Platelets: 413 10*3/uL — ABNORMAL HIGH (ref 150–379)
RBC: 4.06 x10E6/uL (ref 3.77–5.28)
RDW: 14.6 % (ref 12.3–15.4)
WBC: 7 10*3/uL (ref 3.4–10.8)

## 2016-10-11 LAB — COMPREHENSIVE METABOLIC PANEL
ALBUMIN: 4.3 g/dL (ref 3.6–4.8)
ALT: 12 IU/L (ref 0–32)
AST: 17 IU/L (ref 0–40)
Albumin/Globulin Ratio: 1.7 (ref 1.2–2.2)
Alkaline Phosphatase: 51 IU/L (ref 39–117)
BILIRUBIN TOTAL: 0.3 mg/dL (ref 0.0–1.2)
BUN / CREAT RATIO: 15 (ref 12–28)
BUN: 14 mg/dL (ref 8–27)
CHLORIDE: 103 mmol/L (ref 96–106)
CO2: 24 mmol/L (ref 18–29)
CREATININE: 0.94 mg/dL (ref 0.57–1.00)
Calcium: 9.4 mg/dL (ref 8.7–10.3)
GFR calc non Af Amer: 66 mL/min/{1.73_m2} (ref >59)
GFR, EST AFRICAN AMERICAN: 76 mL/min/{1.73_m2} (ref >59)
GLUCOSE: 87 mg/dL (ref 65–99)
Globulin, Total: 2.5 g/dL (ref 1.5–4.5)
Potassium: 4.3 mmol/L (ref 3.5–5.2)
Sodium: 142 mmol/L (ref 134–144)
TOTAL PROTEIN: 6.8 g/dL (ref 6.0–8.5)

## 2016-10-11 LAB — FERRITIN: FERRITIN: 22 ng/mL (ref 15–150)

## 2016-10-11 NOTE — Telephone Encounter (Signed)
-----   Message from Birdie Sons, MD sent at 10/11/2016  7:34 AM EDT ----- Iron levels are a little low, but not anemic. Rest of labs are normal. Continue current medications.  Check yearly.

## 2016-10-11 NOTE — Telephone Encounter (Signed)
Patient advised.KW 

## 2016-12-05 ENCOUNTER — Ambulatory Visit: Payer: Self-pay | Admitting: Physician Assistant

## 2016-12-08 ENCOUNTER — Ambulatory Visit (INDEPENDENT_AMBULATORY_CARE_PROVIDER_SITE_OTHER): Payer: 59 | Admitting: Physician Assistant

## 2016-12-08 ENCOUNTER — Encounter: Payer: Self-pay | Admitting: Physician Assistant

## 2016-12-08 VITALS — BP 122/58 | HR 92 | Temp 98.4°F | Resp 16 | Wt 168.0 lb

## 2016-12-08 DIAGNOSIS — N898 Other specified noninflammatory disorders of vagina: Secondary | ICD-10-CM | POA: Diagnosis not present

## 2016-12-08 DIAGNOSIS — N941 Unspecified dyspareunia: Secondary | ICD-10-CM

## 2016-12-08 NOTE — Progress Notes (Signed)
Patient: Alison Kramer Female    DOB: [birth year]   61 y.o.   MRN: [id number] Visit Date: 12/08/2016  Today's Provider: Trinna Post, PA-C   Chief Complaint  Patient presents with  . Dyspareunia   Subjective:      Alison Kramer is a 61 y/o woman with history of hysterectomy presenting today with pain during sexual intercourse occurring over the past 3 months. She denies new sexual partners, discharge, concerns for STI. Pain is consistent with all sexual intercourse, no variation. She denies vaginal bleeding. Has not been douching.    Vaginal Pain  The patient's primary symptoms include genital lesions. The patient's pertinent negatives include no genital itching, genital odor, genital rash, pelvic pain, vaginal bleeding or vaginal discharge. This is a new problem. The current episode started more than 1 month ago. The problem has been gradually worsening. Associated symptoms include painful intercourse. Pertinent negatives include no dysuria, flank pain, frequency, hematuria, nausea or urgency. No, her partner does not have an STD.       No Known Allergies   Current Outpatient Prescriptions:  .  amphetamine-dextroamphetamine (ADDERALL) 20 MG tablet, Take 1 tablet (20 mg total) by mouth every morning., Disp: 30 tablet, Rfl: 0 .  cetirizine (ZYRTEC) 10 MG tablet, Take 1 tablet (10 mg total) by mouth daily., Disp: 90 tablet, Rfl: 1 .  cholecalciferol (VITAMIN D) 1000 UNITS tablet, Take by mouth., Disp: , Rfl:  .  mesalamine (LIALDA) 1.2 g EC tablet, Take by mouth daily with breakfast., Disp: , Rfl:  .  traZODone (DESYREL) 100 MG tablet, TAKE ONE TABLET BY MOUTH AT BEDTIME, Disp: 30 tablet, Rfl: 6 .  fluticasone (FLONASE) 50 MCG/ACT nasal spray, Place 2 sprays into both nostrils daily. (Patient not taking: Reported on 12/08/2016), Disp: 16 g, Rfl: 5 .  SUMAtriptan (IMITREX) 50 MG tablet, Take 1 tablet (50 mg total) by mouth once. Repeat in 2 hours if headache persists or recurs.  For a total of 163m in 24hrs, Disp: 10 tablet, Rfl: 0  Review of Systems  Constitutional: Negative.   Cardiovascular: Negative.   Gastrointestinal: Negative.  Negative for nausea.  Genitourinary: Positive for vaginal pain. Negative for decreased urine volume, difficulty urinating, dyspareunia, dysuria, enuresis, flank pain, frequency, genital sores, hematuria, menstrual problem, pelvic pain, urgency, vaginal bleeding and vaginal discharge.    Social History  Substance Use Topics  . Smoking status: Never Smoker  . Smokeless tobacco: Never Used  . Alcohol use 1.2 - 4.2 oz/week    2 - 3 Shots of liquor per week     Comment: occasional   Objective:   BP (!) 122/58 (BP Location: Left Arm, Patient Position: Sitting, Cuff Size: Normal)   Pulse 92   Temp 98.4 F (36.9 C) (Oral)   Resp 16   Wt 168 lb (76.2 kg)   BMI 29.29 kg/m  Vitals:   12/08/16 0827  BP: (!) 122/58  Pulse: 92  Resp: 16  Temp: 98.4 F (36.9 C)  TempSrc: Oral  Weight: 168 lb (76.2 kg)     Physical Exam  Constitutional: She is oriented to person, place, and time. She appears well-developed and well-nourished.  Genitourinary: No labial fusion. There is no rash, tenderness, lesion or injury on the right labia. There is no rash, tenderness or injury on the left labia. No erythema, tenderness or bleeding in the vagina. No foreign body in the vagina. No signs of injury around the vagina. Vaginal discharge found.  Genitourinary Comments: There is a darkened lesion on the anterior vaginal vault that is painful with speculum manipulation. Not erythematous or bleeding.   Neurological: She is alert and oriented to person, place, and time.  Skin: Skin is warm and dry.  Psychiatric: She has a normal mood and affect. Her behavior is normal.        Assessment & Plan:     1. Vaginal lesion  - Ambulatory referral to Gynecology  2. Dyspareunia, female  Patient denies vaginal dryness or possibility of STI.   Return if  symptoms worsen or fail to improve.  The entirety of the information documented in the History of Present Illness, Review of Systems and Physical Exam were personally obtained by me. Portions of this information were initially documented by Ashley Royalty, CMA and reviewed by me for thoroughness and accuracy.          Trinna Post, PA-C  [facility]

## 2016-12-08 NOTE — Patient Instructions (Signed)
Dyspareunia, Female Dyspareunia is pain that is associated with sexual activity. This can affect any part of the genitals or lower abdomen, and there are many possible causes. This condition ranges from mild to severe. Depending on the cause, dyspareunia may get better with treatment, or it may return (recur) over time. What are the causes? The cause of this condition is not always known. Possible causes include:  Cancer.  Psychological factors, such as depression, anxiety, or previous traumatic experiences.  Severe pain and tenderness of the skin around the vagina (vulva) when it is touched (vulvar vestibulitis syndrome).  Infection of the pelvis or the vulva.  Infection of the vagina.  Painful, involuntary tightening (contraction) of the vaginal muscles when anything is put inside the vagina (vaginismus).  Allergic reaction.  Ovarian cysts.  Solid growths of tissue (tumors) in the ovaries or the uterus.  Scar tissue in the ovaries, vagina, or pelvis.  Vaginal dryness.  Thinning of the tissue (atrophy) of the vulva and vagina.  Skin conditions that affect the vulva (vulvar dermatoses), such as lichen sclerosus or lichen planus.  Endometriosis.  Tubal pregnancy.  A tilted uterus.  Uterine prolapse.  Adhesions in the vagina.  Bladder problems.  Intestinal problems.  Certain medicines.  Medical conditions such as diabetes, arthritis, or thyroid disease.  What increases the risk? The following factors may make you more likely to develop this condition:  Having experienced physical or sexual trauma.  Having given birth more than once.  Taking birth control pills.  Having gone through menopause.  Having recently given birth, typically within the past 3-6 months.  Breastfeeding.  What are the signs or symptoms? The main symptom of this condition is pain in any part of the genitals or lower abdomen during or after sexual activity. This may include pain  during sexual arousal, genital stimulation, or orgasm. Pain may get worse when anything is inserted into the vagina, or when the genitals are touched in any way, such as when sitting or wearing pants. Pain can range from mild to severe, depending on the cause of the condition. In some cases, symptoms go away with treatment and return (recur) at a later date. How is this diagnosed? This condition may be diagnosed based on:  Your symptoms, including: ? Where your pain is located. ? When your pain occurs.  Your medical history.  A physical exam. This may include a pelvic exam and a Pap test. This is a screening test that is used to check for signs of cancer of the vagina, cervix, and uterus.  Tests, including: ? Blood tests. ? Ultrasound. This uses sound waves to make a picture of the area that is being tested. ? Urine culture. This test involves checking a urine sample for signs of infection. ? Culture test. This is when your health care provider uses a swab to collect a sample of vaginal fluid. The sample is checked for signs of infection. ? X-rays. ? MRI. ? CT scan. ? Laparoscopy. This is a procedure in which a small incision is made in your lower abdomen and a lighted, pencil-sized instrument (laparoscope) is passed through the incision and used to look inside your pelvis.  You may be referred to a health care provider who specializes in women's health (gynecologist). In some cases, diagnosing the cause of dyspareunia can be difficult. How is this treated? Treatment depends on the cause of your condition and your symptoms. In most cases, you may need to stop sexual activity until your symptoms  improve. Treatment may include:  Lubricants.  Kegel exercises or vaginal dilators.  Medicated skin creams.  Medicated vaginal creams.  Hormonal therapy.  Antibiotic medicine to prevent or fight infection.  Medicines that help to relieve pain.  Medicines that treat depression  (antidepressants).  Psychological counseling.  Sex therapy.  Surgery.  Follow these instructions at home: Lifestyle  Avoid tight clothing and irritating materials around your genital and abdominal area.  Use water-based lubricants as needed. Avoid oil-based lubricants.  Do not use any products that irritate you. This may include certain condoms, spermicides, lubricants, soaps, tampons, vaginal sprays, or douches.  Always practice safe sex. Talk with your health care provider about which form of birth control (contraception) is best for you.  Maintain open communication with your sexual partner. General instructions  Take over-the-counter and prescription medicines only as told by your health care provider.  If you had tests done, it is your responsibility to get your tests results. Ask your health care provider or the department performing the test when your results will be ready.  Urinate before you engage in sexual activity.  Consider joining a support group.  Keep all follow-up visits as told by your health care provider. This is important. Contact a health care provider if:  You develop vaginal bleeding after sexual intercourse.  You develop a lump at the opening of your vagina. Seek medical care even if the lump is painless.  You have: ? Abnormal vaginal discharge. ? Vaginal dryness. ? Itchiness or irritation of your vulva or vagina. ? A new rash. ? Symptoms that get worse or do not improve with treatment. ? A fever. ? Pain when you urinate. ? Blood in your urine. Get help right away if:  You develop severe pain in your abdomen during or shortly after sexual intercourse.  You pass out after having sexual intercourse. This information is not intended to replace advice given to you by your health care provider. Make sure you discuss any questions you have with your health care provider. Document Released: 06/11/2007 Document Revised: 10/01/2015 Document  Reviewed: 12/22/2014 Elsevier Interactive Patient Education  [employer].

## 2017-01-01 ENCOUNTER — Encounter: Payer: Self-pay | Admitting: Obstetrics and Gynecology

## 2017-01-01 ENCOUNTER — Ambulatory Visit (INDEPENDENT_AMBULATORY_CARE_PROVIDER_SITE_OTHER): Payer: 59 | Admitting: Obstetrics and Gynecology

## 2017-01-01 VITALS — BP 128/58 | HR 98 | Ht 64.0 in | Wt 173.0 lb

## 2017-01-01 DIAGNOSIS — N898 Other specified noninflammatory disorders of vagina: Secondary | ICD-10-CM | POA: Diagnosis not present

## 2017-01-01 MED ORDER — ESTROGENS, CONJUGATED 0.625 MG/GM VA CREA: 1.0000 | TOPICAL_CREAM | VAGINAL | 3 refills | Status: DC

## 2017-01-01 NOTE — Progress Notes (Signed)
Obstetrics & Gynecology Office Visit   Chief Complaint:  Chief Complaint  Patient presents with  . vaginal lesion    referred by [facility] vaginal lesion painful during intercourse only    History of Present Illness: 61 year old postmenopausal female, s/p prior hysterectomy, who has noted a vaginal lesion approximately 1 month ago.  This lesion is not painful at baseline but does result in dyspareunia.  No discharge or bleeding.  There was scarring noted on the anterior vaginal wall approximately 1 cm from the urethral meatus and on further questioning the patient does report concurrent "bladder tack".  No dyspareunia or history of recurrent UTI's.   Review of Systems: 10 point review of systems negative unless otherwise noted in HPI  Past Medical History:  Past Medical History:  Diagnosis Date  . ADHD (attention deficit hyperactivity disorder)   . Anxiety   . Colitis, ulcerative ([place])   . Depression     Past Surgical History:  Past Surgical History:  Procedure Laterality Date  . ABDOMINAL HYSTERECTOMY  1981  . BACK SURGERY    . BUNIONECTOMY      Gynecologic History: No LMP recorded. Patient has had a hysterectomy.  Obstetric History: G3P3  Family History:  Family History  Problem Relation Age of Onset  . Breast cancer Mother   . Hyperlipidemia Mother   . Heart Problems Father   . CAD Father   . Pancreatic disease Father   . Hypertension Brother   . Obesity Brother   . Heart Problems Brother   . Diabetes Brother     Social History:  Social History   Social History  . Marital status: Married    Spouse name: N/A  . Number of children: 2  . Years of education: N/A   Occupational History  . 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 Smoking status: Never Smoker  . Smokeless tobacco: Never Used  . Alcohol use 1.2 - 4.2 oz/week    2 - 3 Shots of liquor per week     Comment: occasional  . Drug use: No  . Sexual activity: Yes    Birth  control/ protection: None   Other Topics Concern  . Not on file   Social History Narrative  . No narrative on file    Allergies:  No Known Allergies  Medications: Prior to Admission medications   Medication Sig Start Date End Date Taking? Authorizing Provider  amphetamine-dextroamphetamine (ADDERALL) 20 MG tablet Take 1 tablet (20 mg total) by mouth every morning. 10/10/16  Yes Birdie Sons, MD  cetirizine (ZYRTEC) 10 MG tablet Take 1 tablet (10 mg total) by mouth daily. 09/29/15  Yes Margarita Rana, MD  cholecalciferol (VITAMIN D) 1000 UNITS tablet Take by mouth.   Yes [provider]  fluticasone (FLONASE) 50 MCG/ACT nasal spray Place 2 sprays into both nostrils daily. 09/29/15  Yes Margarita Rana, MD  mesalamine (LIALDA) 1.2 g EC tablet Take by mouth daily with breakfast.   Yes [provider]  Omega-3 Fatty Acids (FISH OIL PO) Take by mouth.   Yes [provider]  prednisoLONE sodium phosphate (INFLAMASE FORTE) 1 % ophthalmic solution Apply to eye. 11/17/09  Yes [provider]  traZODone (DESYREL) 100 MG tablet TAKE ONE TABLET BY MOUTH AT BEDTIME 07/05/16  Yes Birdie Sons, MD  SUMAtriptan (IMITREX) 50 MG tablet Take 1 tablet (50 mg total) by mouth once. Repeat in 2 hours if headache persists or  recurs. For a total of 135m in 24hrs 01/12/16 01/12/16  BMar Daring PA-C    Physical Exam Vitals:  Vitals:   01/01/17 0817  BP: (!) 128/58  Pulse: 98   No LMP recorded. Patient has had a hysterectomy.  General: NAD HEENT: normocephalic, anicteric Pulmonary: No increased work of breathing Abdomen: NABS, soft, non-tender, non-distended.  Umbilicus without lesions.  No hepatomegaly, splenomegaly or masses palpable. No evidence of hernia  Genitourinary:  External: Normal external female genitalia.  Normal urethral meatus, normal Bartholin's and Skene's glands.    Vagina: Normal vaginal mucosa, no evidence of prolapse, small amount of  scarring noted on anterior vaginal wall 1cm past urethral meatus.  On physical exam a small nodule is able to be felt in the superficial muscosa    Cervix: surgically absent  Uterus:  Surgically absent  Adnexa: ovaries non-enlarged, no adnexal masses  Rectal: deferred  Lymphatic: no evidence of inguinal lymphadenopathy Extremities: no edema, erythema, or tenderness Neurologic: Grossly intact Psychiatric: mood appropriate, affect full  Female chaperone present for pelvic and breast  portions of the physical exam  Assessment: 61y.o. G3P3 anterior vaginal wall inclusion cyst Plan: Problem List Items Addressed This Visit    None    Visit Diagnoses    Vaginal inclusion cyst    -  Primary     - trial of premarin - assess response in 6 weeks - risk factor likley prior  - A total of 15 minutes were spent in face-to-face contact with the patient during this encounter with over half of that time devoted to counseling and coordination of care.

## 2017-01-12 ENCOUNTER — Other Ambulatory Visit: Payer: Self-pay | Admitting: Family Medicine

## 2017-01-12 DIAGNOSIS — F988 Other specified behavioral and emotional disorders with onset usually occurring in childhood and adolescence: Secondary | ICD-10-CM

## 2017-01-12 NOTE — Telephone Encounter (Signed)
Pt contacted office for refill request on the following medications:  amphetamine-dextroamphetamine (ADDERALL) 20 MG tablet  Pt is requesting a 3 prescriptions.  SK#[id number]

## 2017-01-15 ENCOUNTER — Other Ambulatory Visit: Payer: Self-pay | Admitting: Family Medicine

## 2017-01-15 DIAGNOSIS — F988 Other specified behavioral and emotional disorders with onset usually occurring in childhood and adolescence: Secondary | ICD-10-CM

## 2017-01-15 MED ORDER — AMPHETAMINE-DEXTROAMPHETAMINE 20 MG PO TABS: 20.0000 mg | ORAL_TABLET | ORAL | 0 refills | Status: DC

## 2017-02-12 ENCOUNTER — Other Ambulatory Visit: Payer: Self-pay | Admitting: Family Medicine

## 2017-02-12 DIAGNOSIS — G4700 Insomnia, unspecified: Secondary | ICD-10-CM

## 2017-02-13 ENCOUNTER — Ambulatory Visit (INDEPENDENT_AMBULATORY_CARE_PROVIDER_SITE_OTHER): Payer: 59 | Admitting: Obstetrics and Gynecology

## 2017-02-13 ENCOUNTER — Encounter: Payer: Self-pay | Admitting: Obstetrics and Gynecology

## 2017-02-13 VITALS — BP 110/66 | HR 99 | Wt 173.0 lb

## 2017-02-13 DIAGNOSIS — N941 Unspecified dyspareunia: Secondary | ICD-10-CM | POA: Diagnosis not present

## 2017-02-13 DIAGNOSIS — N898 Other specified noninflammatory disorders of vagina: Secondary | ICD-10-CM | POA: Diagnosis not present

## 2017-02-13 NOTE — Progress Notes (Signed)
Obstetrics & Gynecology Office Visit   Chief Complaint:  Chief Complaint  Patient presents with  . Follow-up    inclusion cyst    History of Present Illness: 61 year old patient presenting for follow up of vaginal inclusion cyst vs scar tissue of the anterior vaginal wall from prior TVT.  The patient states she has noted marked improvements in her symptoms, cessation of dyspareunia with addition of vaginal premarin.  She has not noted any abnormal bleeding (s/p prior hysterectomy) or other side-effects.  She has titrated down to twice weekly dosing.    She inquires about length of therapy and whether cessation is warranted at any point.     Review of Systems: 10 point review of systems negative unless otherwise noted in HPI  Past Medical History:  Past Medical History:  Diagnosis Date  . ADHD (attention deficit hyperactivity disorder)   . Anxiety   . Colitis, ulcerative ([place])   . Depression     Past Surgical History:  Past Surgical History:  Procedure Laterality Date  . ABDOMINAL HYSTERECTOMY  1981  . BACK SURGERY    . BUNIONECTOMY      Gynecologic History: No LMP recorded. Patient has had a hysterectomy.  Obstetric History: G3P3  Family History:  Family History  Problem Relation Age of Onset  . Breast cancer Mother   . Hyperlipidemia Mother   . Heart Problems Father   . CAD Father   . Pancreatic disease Father   . Hypertension Brother   . Obesity Brother   . Heart Problems Brother   . Diabetes Brother     Social History:  Social History   Social History  . Marital status: Married    Spouse name: N/A  . Number of children: 2  . Years of education: N/A   Occupational History  . 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 Smoking status: Never Smoker  . Smokeless tobacco: Never Used  . Alcohol use 1.2 - 4.2 oz/week    2 - 3 Shots of liquor per week     Comment: occasional  . Drug use: No  . Sexual activity: Yes    Birth control/  protection: None   Other Topics Concern  . Not on file   Social History Narrative  . No narrative on file    Allergies:  No Known Allergies  Medications: Prior to Admission medications   Medication Sig Start Date End Date Taking? Authorizing Provider  amphetamine-dextroamphetamine (ADDERALL) 20 MG tablet Take 1 tablet (20 mg total) by mouth every morning. 01/15/17  Yes Birdie Sons, MD  cetirizine (ZYRTEC) 10 MG tablet Take 1 tablet (10 mg total) by mouth daily. 09/29/15  Yes Margarita Rana, MD  cholecalciferol (VITAMIN D) 1000 UNITS tablet Take by mouth.   Yes [provider]  conjugated estrogens (PREMARIN) vaginal cream Place 1 Applicatorful vaginally 2 (two) times a week. 01/01/17  Yes Malachy Mood, MD  fluticasone (FLONASE) 50 MCG/ACT nasal spray Place 2 sprays into both nostrils daily. 09/29/15  Yes Margarita Rana, MD  mesalamine (LIALDA) 1.2 g EC tablet Take by mouth daily with breakfast.   Yes [provider]  Omega-3 Fatty Acids (FISH OIL PO) Take by mouth.   Yes [provider]  traZODone (DESYREL) 100 MG tablet TAKE ONE TABLET BY MOUTH AT BEDTIME 02/12/17  Yes Birdie Sons, MD  SUMAtriptan (IMITREX) 50 MG tablet Take 1 tablet (50 mg total) by mouth once. Repeat  in 2 hours if headache persists or recurs. For a total of 140m in 24hrs 01/12/16 01/12/16  BMar Daring PA-C    Physical Exam Vitals:  Vitals:   02/13/17 0806  BP: 110/66  Pulse: 99   No LMP recorded. Patient has had a hysterectomy.  General: NAD HEENT: normocephalic, anicteric Thyroid: no enlargement, no palpable nodules Pulmonary: No increased work of breathing Cardiovascular: RRR, distal pulses 2+ Abdomen: NABS, soft, non-tender, non-distended.  Umbilicus without lesions.  No hepatomegaly, splenomegaly or masses palpable. No evidence of hernia  Genitourinary:  External: Normal external female genitalia.  Normal urethral meatus, normal Bartholin's and Skene's  glands.    Vagina: Normal vaginal mucosa, no evidence of prolapse.  Area anterior vaginal wall 1cm from urethra still palpable but non-tender  Cervix: surgically absent  Uterus: surgically absent  Adnexa: ovaries non-enlarged, no adnexal masses  Rectal: deferred  Lymphatic: no evidence of inguinal lymphadenopathy Extremities: no edema, erythema, or tenderness Neurologic: Grossly intact Psychiatric: mood appropriate, affect full  Female chaperone present for pelvic and breast  portions of the physical exam  Assessment: 61y.o. G3P3 with likely scar tissue from prior TVT, medication follow up  Plan: Problem List Items Addressed This Visit    None    Visit Diagnoses    Vaginal scar    -  Primary   Dyspareunia, female          - Medication follow up today with good improvement in symptoms.  Continue premarin.  Discussed symptoms likely to return if premarin cessation.  Other option would be mesh excision but this would be done at a tertiary care center that does a larger volume of these.  If she decides to self discontinue she may have to do repeat courses as needed for symptoms. - A total of 15 minutes were spent in face-to-face contact with the patient during this encounter with over half of that time devoted to counseling and coordination of care.

## 2017-05-04 ENCOUNTER — Other Ambulatory Visit: Payer: Self-pay | Admitting: Family Medicine

## 2017-05-04 DIAGNOSIS — F988 Other specified behavioral and emotional disorders with onset usually occurring in childhood and adolescence: Secondary | ICD-10-CM

## 2017-05-04 MED ORDER — AMPHETAMINE-DEXTROAMPHETAMINE 20 MG PO TABS: 20.0000 mg | ORAL_TABLET | ORAL | 0 refills | Status: DC

## 2017-05-04 NOTE — Telephone Encounter (Signed)
Pt contacted office for refill request on the following medications:  amphetamine-dextroamphetamine (ADDERALL) 20 MG tablet  [facility].  Please advise. Thanks TNP

## 2017-05-12 IMAGING — US US BREAST LTD UNI RIGHT INC AXILLA
1 series · 6 of 6 positions shown · non-contrast
Comparison: None.

CLINICAL DATA: 59-year-old female states she felt a palpable
abnormality in the 9 o'clock subareolar region of the right breast.
The patient states it was erythematous. She states the erythema has
resolved and the palpable lump is smaller.

EXAM:
DIGITAL DIAGNOSTIC BILATERAL MAMMOGRAM WITH 3D TOMOSYNTHESIS WITH
CAD
ULTRASOUND RIGHT BREAST

[Series 1: us breast ltd uni right inc axilla · 0.05mm/px · 6 of 6 slices shown]
[im 1/6]
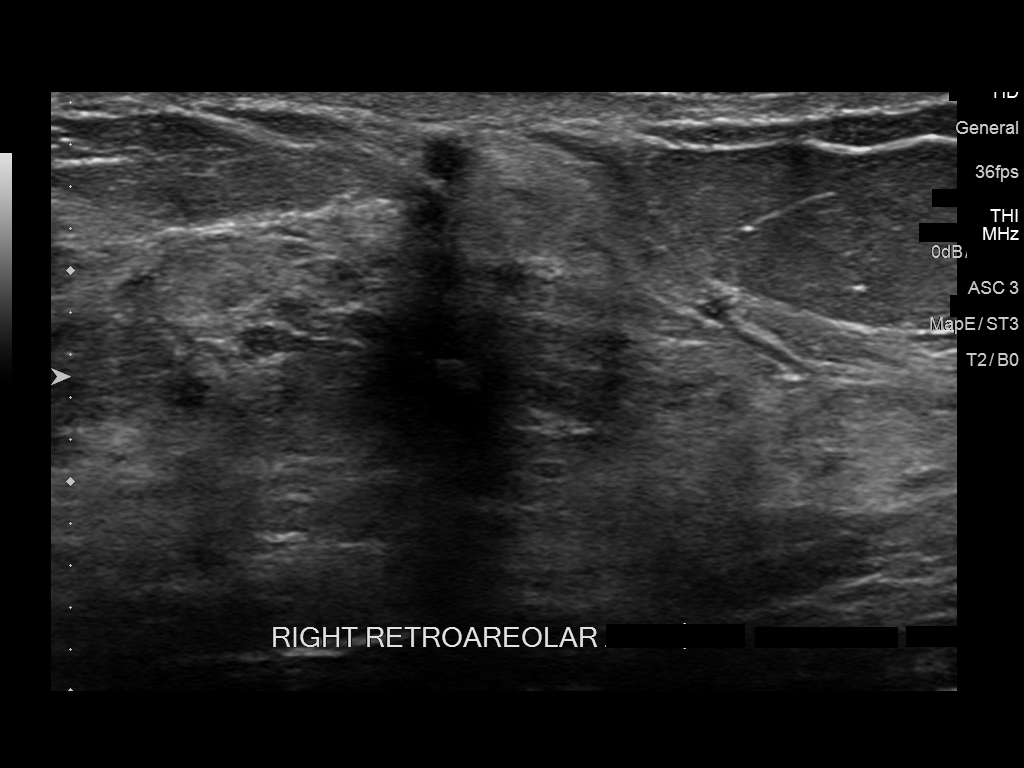
[im 2/6]
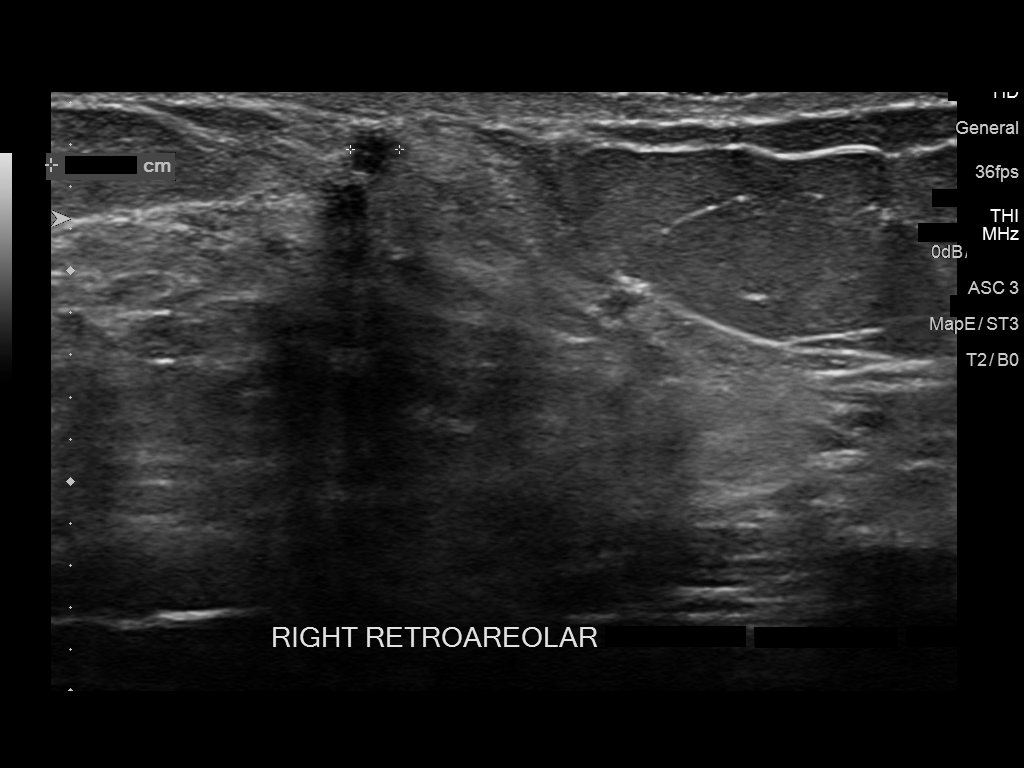
[im 3/6]
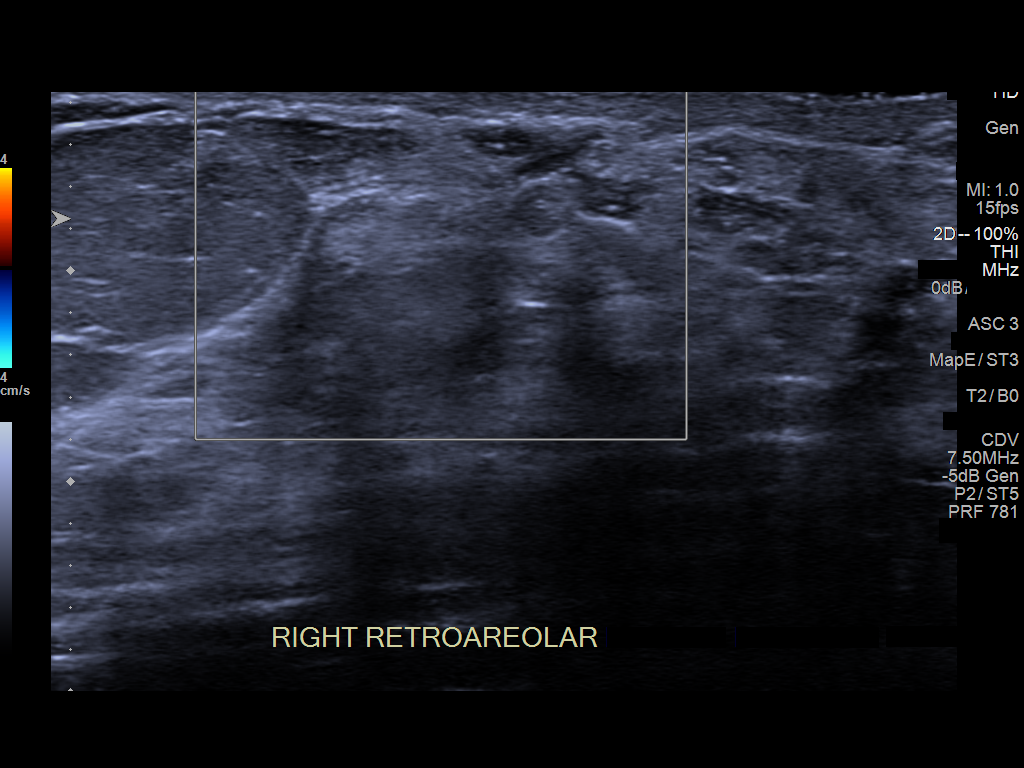
[im 4/6]
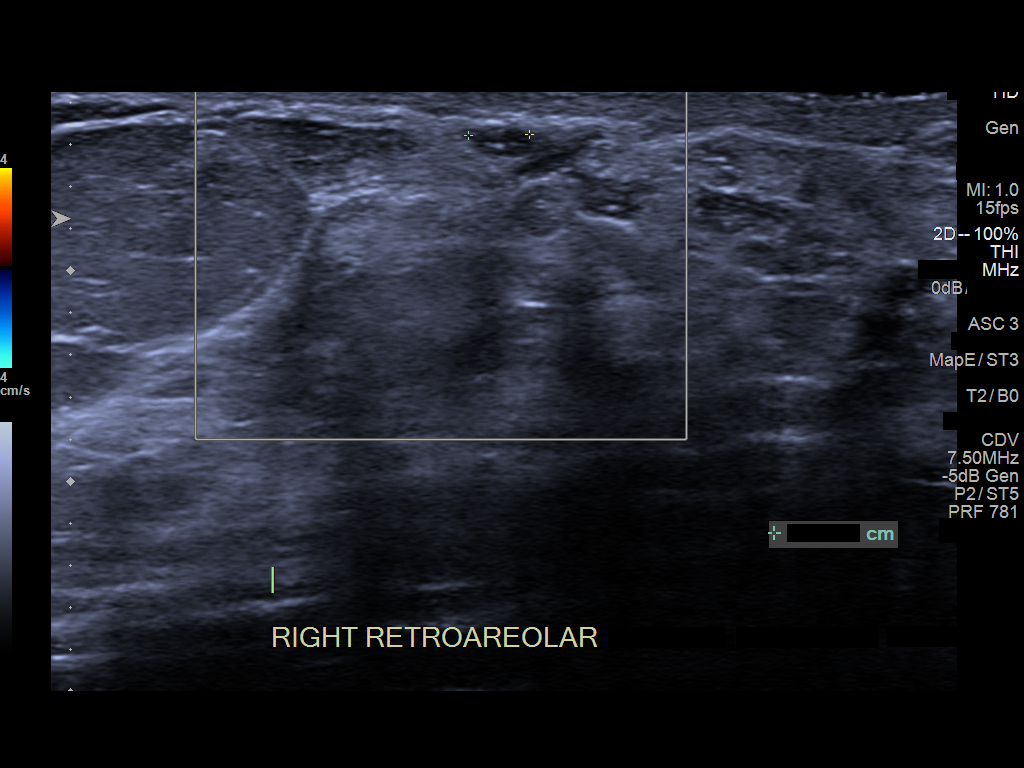
[im 5/6]
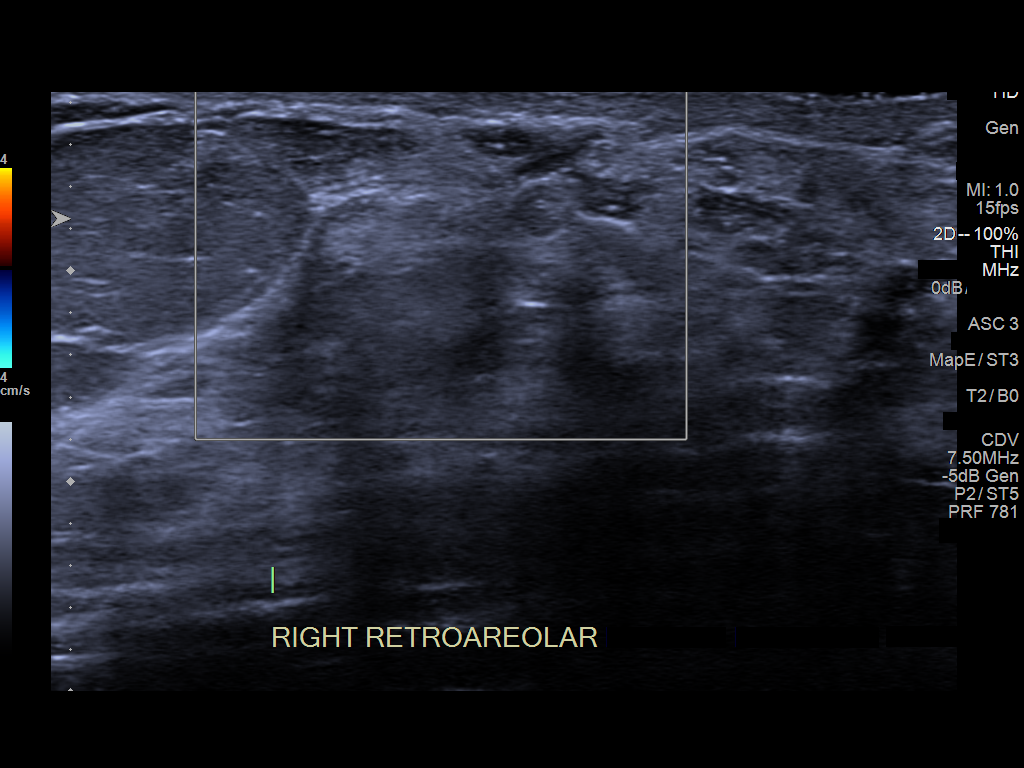
[im 6/6]
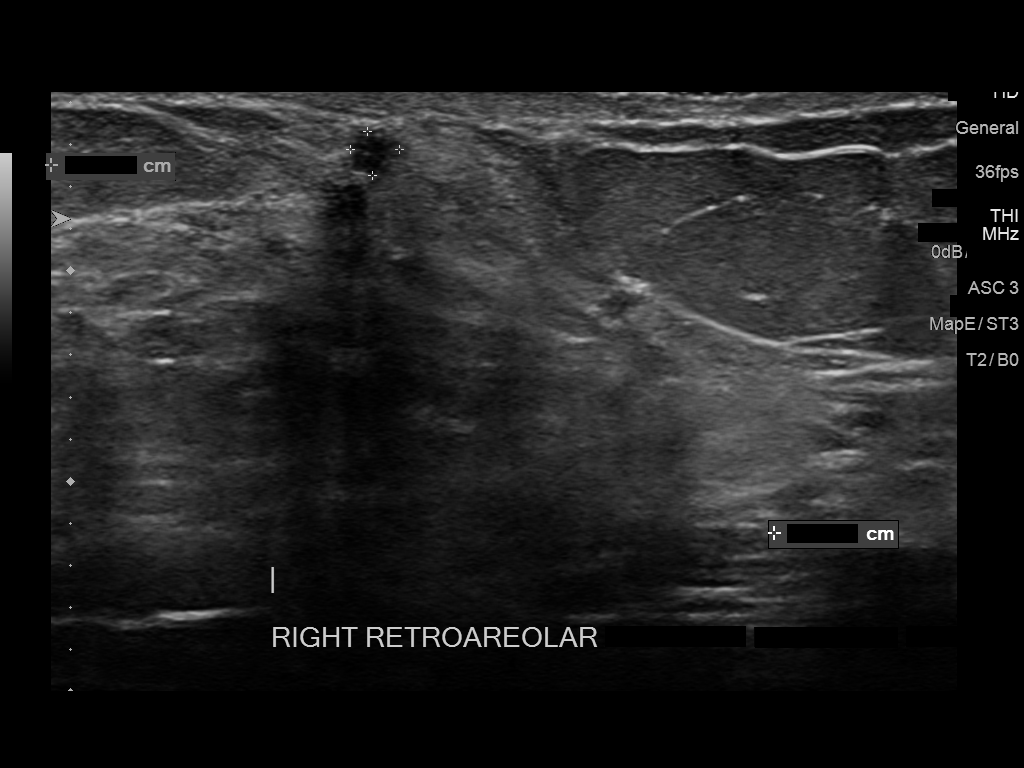

[6 of 6 positions shown; findings below may reference images not displayed]

ACR Breast Density Category b: There are scattered areas of
fibroglandular density.
FINDINGS: No suspicious mass, malignant type microcalcifications or distortion
detected in either breast.

Mammographic images were processed with CAD.

On physical exam, I palpate a BB sized nodule in the right breast at
9 o'clock in the retroareolar region.

Targeted ultrasound is performed, showing a well-circumscribed
hypoechoic lesion superficially in the right retroareolar region at
9 o'clock measuring 2 x 3 x 2 mm. It is felt to likely be benign. It
may be a cyst.
IMPRESSION: Probable benign lesion in the 9 o'clock retroareolar region of the
right breast.

RECOMMENDATION:
Short-term interval followup right breast ultrasound in 6 months is
recommended. The importance of self-breast examination was discussed
with the patient.

I have discussed the findings and recommendations with the patient.
Results were also provided in writing at the conclusion of the
visit. If applicable, a reminder letter will be sent to the patient
regarding the next appointment.

BI-RADS CATEGORY  3: Probably benign.

## 2017-07-10 ENCOUNTER — Ambulatory Visit: Payer: Managed Care, Other (non HMO) | Admitting: Family Medicine

## 2017-07-10 ENCOUNTER — Encounter: Payer: Self-pay | Admitting: Family Medicine

## 2017-07-10 ENCOUNTER — Telehealth: Payer: Self-pay | Admitting: Family Medicine

## 2017-07-10 VITALS — BP 124/62 | HR 110 | Temp 100.2°F | Resp 20 | Ht 64.0 in | Wt 177.0 lb

## 2017-07-10 DIAGNOSIS — R05 Cough: Secondary | ICD-10-CM

## 2017-07-10 DIAGNOSIS — J321 Chronic frontal sinusitis: Secondary | ICD-10-CM

## 2017-07-10 DIAGNOSIS — J40 Bronchitis, not specified as acute or chronic: Secondary | ICD-10-CM | POA: Diagnosis not present

## 2017-07-10 DIAGNOSIS — R059 Cough, unspecified: Secondary | ICD-10-CM

## 2017-07-10 LAB — POCT INFLUENZA A/B
Influenza A, POC: NEGATIVE
Influenza B, POC: NEGATIVE

## 2017-07-10 MED ORDER — HYDROCODONE-HOMATROPINE 5-1.5 MG/5ML PO SYRP
5.0000 mL | ORAL_SOLUTION | Freq: Three times a day (TID) | ORAL | 0 refills | Status: DC | PRN
Start: 2017-07-10 — End: 2017-08-06

## 2017-07-10 MED ORDER — HYDROCODONE-HOMATROPINE 5-1.5 MG/5ML PO SYRP: 5.0000 mL | ORAL_SOLUTION | Freq: Three times a day (TID) | ORAL | 0 refills | Status: DC | PRN

## 2017-07-10 MED ORDER — AZITHROMYCIN 250 MG PO TABS: ORAL_TABLET | ORAL | 0 refills | Status: DC

## 2017-07-10 NOTE — Progress Notes (Signed)
Patient: Alison Kramer Female    DOB: 1956/04/05   62 y.o.   MRN: [id number] Visit Date: 07/10/2017  Today's Provider: Lelon Huh, MD   Chief Complaint  Patient presents with  . Cough   Subjective:    Cough  This is a new problem. The current episode started in the past 7 days (about 5 days). The problem has been gradually worsening. The cough is productive of sputum. Associated symptoms include a fever, headaches, myalgias, postnasal drip, rhinorrhea and a sore throat. Pertinent negatives include no chest pain, chills or shortness of breath. Nothing aggravates the symptoms. She has tried OTC cough suppressant and rest for the symptoms. The treatment provided mild relief. Her past medical history is significant for environmental allergies.  Onset of scratchy throat on 07-05-2017 followed by persistent cough and muscles aches the next. Sputum is dark yellow and green in the morning. Also has pressure in her ears and sinuses.      No Known Allergies   Current Outpatient Medications:  .  amphetamine-dextroamphetamine (ADDERALL) 20 MG tablet, Take 1 tablet (20 mg total) by mouth every morning., Disp: 30 tablet, Rfl: 0 .  cetirizine (ZYRTEC) 10 MG tablet, Take 1 tablet (10 mg total) by mouth daily., Disp: 90 tablet, Rfl: 1 .  cholecalciferol (VITAMIN D) 1000 UNITS tablet, Take by mouth., Disp: , Rfl:  .  conjugated estrogens (PREMARIN) vaginal cream, Place 1 Applicatorful vaginally 2 (two) times a week., Disp: 30 g, Rfl: 3 .  fluticasone (FLONASE) 50 MCG/ACT nasal spray, Place 2 sprays into both nostrils daily., Disp: 16 g, Rfl: 5 .  mesalamine (LIALDA) 1.2 g EC tablet, Take by mouth daily with breakfast., Disp: , Rfl:  .  Omega-3 Fatty Acids (FISH OIL PO), Take by mouth., Disp: , Rfl:  .  traZODone (DESYREL) 100 MG tablet, TAKE ONE TABLET BY MOUTH AT BEDTIME, Disp: 30 tablet, Rfl: 12 .  SUMAtriptan (IMITREX) 50 MG tablet, Take 1 tablet (50 mg total) by mouth once. Repeat in 2  hours if headache persists or recurs. For a total of 176m in 24hrs, Disp: 10 tablet, Rfl: 0  Review of Systems  Constitutional: Positive for fever. Negative for appetite change, chills and fatigue.  HENT: Positive for congestion, postnasal drip, rhinorrhea and sore throat.   Respiratory: Positive for cough. Negative for chest tightness and shortness of breath.   Cardiovascular: Negative for chest pain and palpitations.  Gastrointestinal: Negative for abdominal pain, nausea and vomiting.  Musculoskeletal: Positive for myalgias.  Allergic/Immunologic: Positive for environmental allergies.  Neurological: Positive for headaches. Negative for dizziness and weakness.    Social History   Tobacco Use  . Smoking status: Never Smoker  . Smokeless tobacco: Never Used  Substance Use Topics  . Alcohol use: Yes    Alcohol/week: 1.2 - 4.2 oz    Types: 2 - 3 Shots of liquor per week    Comment: occasional   Objective:   BP 124/62 (BP Location: Left Arm, Patient Position: Sitting, Cuff Size: Normal)   Pulse (!) 110   Temp 100.2 F (37.9 C)   Resp 20   Ht 5' 4"  (1.626 m)   Wt 177 lb (80.3 kg)   SpO2 96%   BMI 30.38 kg/m  Vitals:   07/10/17 1013  BP: 124/62  Pulse: (!) 110  Resp: 20  Temp: 100.2 F (37.9 C)  SpO2: 96%  Weight: 177 lb (80.3 kg)  Height: 5' 4"  (1.626 m)  Physical Exam  General Appearance:    Alert, cooperative, no distress  HENT:   bilateral TM normal without fluid or infection, neck without nodes, pharynx erythematous without exudate, frontal sinus tender and nasal mucosa congested  Eyes:    PERRL, conjunctiva/corneas clear, EOM's intact       Lungs:     Occasional faint expiratory wheeze, no rales,  respirations unlabored  Heart:    Regular rate and rhythm  Neurologic:   Awake, alert, oriented x 3. No apparent focal neurological           defect.         Results for orders placed or performed in visit on 07/10/17  POCT Influenza A/B  Result Value Ref  Range   Influenza A, POC Negative Negative   Influenza B, POC Negative Negative       Assessment & Plan:     1. Cough  - POCT Influenza A/B - HYDROcodone-homatropine (HYCODAN) 5-1.5 MG/5ML syrup; Take 5 mLs by mouth every 8 (eight) hours as needed for cough.  Dispense: 100 mL; Refill: 0  2. Bronchitis  - azithromycin (ZITHROMAX) 250 MG tablet; 2 by mouth today, then 1 daily for 4 days  Dispense: 6 tablet; Refill: 0  Call if symptoms change or if not rapidly improving.          Lelon Huh, MD  [facility]

## 2017-07-10 NOTE — Telephone Encounter (Signed)
Melissa with [facility] stated they don't have HYDROcodone-homatropine (HYCODAN) 5-1.5 MG/5ML syrup and because the Rx is for a Class 2 drug they can't transfer the Rx. Lenna Sciara is requesting that a new Rx be sent to [facility] on [address]. Melissa stated she has contacted them and verified they do have the medication on hand. Please advise. Thanks TNP

## 2017-07-10 NOTE — Telephone Encounter (Signed)
Please advise 

## 2017-07-10 NOTE — Patient Instructions (Signed)

## 2017-07-13 ENCOUNTER — Telehealth: Payer: Self-pay | Admitting: Family Medicine

## 2017-07-13 MED ORDER — LEVOFLOXACIN 750 MG PO TABS: 750.0000 mg | ORAL_TABLET | Freq: Every day | ORAL | 0 refills | Status: AC

## 2017-07-13 MED ORDER — PREDNISONE 20 MG PO TABS: 20.0000 mg | ORAL_TABLET | Freq: Two times a day (BID) | ORAL | 0 refills | Status: AC

## 2017-07-13 NOTE — Telephone Encounter (Signed)
Sent in meds into the pharmacy listed below. L/M stating that it was sent.

## 2017-07-13 NOTE — Telephone Encounter (Signed)
Pt saw Dr. Caryn Section on 07/10/17. Pt stated that she is still coughing and the HYDROcodone-homatropine (HYCODAN) 5-1.5 MG/5ML syrup doesn't seem to help suppress the cough. Pt is requesting call back to discuss what else she can try.   [place] Please advise. Thanks TNP

## 2017-07-13 NOTE — Telephone Encounter (Signed)
If she is having any fever or shortness of breath then she needs to go to Urgent Care. Otherwise change antibiotic to levofloxacin 740m daily for 5 days(#5) and prednisone 259mtwice a day for 5 days(#10)

## 2017-07-13 NOTE — Telephone Encounter (Signed)
Pt calling back wanting to know status of her request.  States she is coughing until she vomits now.

## 2017-07-13 NOTE — Telephone Encounter (Signed)
Please review. Thanks!  

## 2017-07-25 ENCOUNTER — Other Ambulatory Visit: Payer: Self-pay | Admitting: Family Medicine

## 2017-07-25 DIAGNOSIS — F988 Other specified behavioral and emotional disorders with onset usually occurring in childhood and adolescence: Secondary | ICD-10-CM

## 2017-07-25 MED ORDER — AMPHETAMINE-DEXTROAMPHETAMINE 20 MG PO TABS: 20.0000 mg | ORAL_TABLET | ORAL | 0 refills | Status: DC

## 2017-07-25 NOTE — Telephone Encounter (Signed)
Needs refill on Adderall 20 mg. To [facility]

## 2017-07-25 NOTE — Telephone Encounter (Signed)
Please review. Thanks!  

## 2017-08-06 ENCOUNTER — Ambulatory Visit: Payer: Managed Care, Other (non HMO) | Admitting: Family Medicine

## 2017-08-06 ENCOUNTER — Encounter: Payer: Self-pay | Admitting: Family Medicine

## 2017-08-06 VITALS — BP 130/80 | HR 98 | Temp 98.3°F | Resp 16 | Wt 178.0 lb

## 2017-08-06 DIAGNOSIS — J45909 Unspecified asthma, uncomplicated: Secondary | ICD-10-CM | POA: Diagnosis not present

## 2017-08-06 DIAGNOSIS — R059 Cough, unspecified: Secondary | ICD-10-CM

## 2017-08-06 DIAGNOSIS — R05 Cough: Secondary | ICD-10-CM

## 2017-08-06 MED ORDER — ALBUTEROL SULFATE HFA 108 (90 BASE) MCG/ACT IN AERS
2.0000 | INHALATION_SPRAY | Freq: Four times a day (QID) | RESPIRATORY_TRACT | 2 refills | Status: DC | PRN
Start: 2017-08-06 — End: 2019-11-06

## 2017-08-06 MED ORDER — HYDROCOD POLST-CPM POLST ER 10-8 MG/5ML PO SUER: 5.0000 mL | Freq: Two times a day (BID) | ORAL | 0 refills | Status: DC | PRN

## 2017-08-06 NOTE — Patient Instructions (Signed)

## 2017-08-06 NOTE — Progress Notes (Signed)
Patient: Alison Kramer Female    DOB: 1955-08-14   62 y.o.   MRN: [id number] Visit Date: 08/06/2017  Today's Provider: Lavon Paganini, MD   I, Martha Clan, CMA, am acting as scribe for Lavon Paganini, MD.  Chief Complaint  Patient presents with  . Cough   Subjective:    Cough  This is a recurrent (pt saw Dr. Caryn Section on 07/10/2017 for bronchitis. She was prescribed Z Pak, which was later changed to [place] on 07/13/2017, and Prednisone was added at that time. Pt states she did not start the Levaquin because she did not feel comfortable with abx. ) problem. Episode onset: sx restarted yesterday, and kept pt awake all night last night. Cough characteristics: mostly non-productive. Associated symptoms include chest pain (tightness), headaches, heartburn, rhinorrhea, shortness of breath and wheezing. Pertinent negatives include no chills, ear congestion, ear pain, fever, hemoptysis, nasal congestion, postnasal drip, sore throat, sweats or weight loss. Treatments tried: pt went to urgent care after seeing Dr. Caryn Section. They prescribed codeine cough syrup, tessalon Perles, and albuterol inhaler. The treatment provided significant relief.       No Known Allergies   Current Outpatient Medications:  .  amphetamine-dextroamphetamine (ADDERALL) 20 MG tablet, Take 1 tablet (20 mg total) by mouth every morning., Disp: 30 tablet, Rfl: 0 .  Carboxymethylcellulose Sodium (EYE DROPS OP), Apply to eye., Disp: , Rfl:  .  cetirizine (ZYRTEC) 10 MG tablet, Take 1 tablet (10 mg total) by mouth daily., Disp: 90 tablet, Rfl: 1 .  cholecalciferol (VITAMIN D) 1000 UNITS tablet, Take by mouth., Disp: , Rfl:  .  fluticasone (FLONASE) 50 MCG/ACT nasal spray, Place 2 sprays into both nostrils daily., Disp: 16 g, Rfl: 5 .  mesalamine (LIALDA) 1.2 g EC tablet, Take by mouth daily with breakfast., Disp: , Rfl:  .  Omega-3 Fatty Acids (FISH OIL PO), Take by mouth., Disp: , Rfl:  .  Probiotic Product  (PROBIOTIC ADVANCED PO), Take by mouth., Disp: , Rfl:  .  SUMAtriptan (IMITREX) 50 MG tablet, Take 1 tablet (50 mg total) by mouth once. Repeat in 2 hours if headache persists or recurs. For a total of 16m in 24hrs, Disp: 10 tablet, Rfl: 0 .  traZODone (DESYREL) 100 MG tablet, TAKE ONE TABLET BY MOUTH AT BEDTIME, Disp: 30 tablet, Rfl: 12  Review of Systems  Constitutional: Negative for chills, fever and weight loss.  HENT: Positive for rhinorrhea. Negative for ear pain, postnasal drip and sore throat.   Respiratory: Positive for cough, shortness of breath and wheezing. Negative for hemoptysis.   Cardiovascular: Positive for chest pain (tightness).  Gastrointestinal: Positive for heartburn.  Neurological: Positive for headaches.    Social History   Tobacco Use  . Smoking status: Never Smoker  . Smokeless tobacco: Never Used  Substance Use Topics  . Alcohol use: Yes    Alcohol/week: 1.2 - 4.2 oz    Types: 2 - 3 Shots of liquor per week    Comment: occasional   Objective:   BP 130/80 (BP Location: Left Arm, Patient Position: Sitting, Cuff Size: Normal)   Pulse 98   Temp 98.3 F (36.8 C) (Oral)   Resp 16   Wt 178 lb (80.7 kg)   SpO2 98%   BMI 30.55 kg/m  Vitals:   08/06/17 0948  BP: 130/80  Pulse: 98  Resp: 16  Temp: 98.3 F (36.8 C)  TempSrc: Oral  SpO2: 98%  Weight: 178 lb (80.7 kg)  Physical Exam  Constitutional: She is oriented to person, place, and time. She appears well-developed and well-nourished. No distress.  HENT:  Head: Normocephalic and atraumatic.  Right Ear: External ear normal.  Left Ear: External ear normal.  Nose: Nose normal.  Mouth/Throat: Oropharynx is clear and moist. No oropharyngeal exudate.  Eyes: Conjunctivae and EOM are normal. Pupils are equal, round, and reactive to light. Right eye exhibits no discharge. Left eye exhibits no discharge. No scleral icterus.  Neck: Neck supple. No thyromegaly present.  Cardiovascular: Normal rate,  regular rhythm, normal heart sounds and intact distal pulses.  No murmur heard. Pulmonary/Chest: Effort normal. No respiratory distress. She has wheezes (in bases of b/l lungs). She has no rales.  Musculoskeletal: She exhibits no edema.  Lymphadenopathy:    She has no cervical adenopathy.  Neurological: She is alert and oriented to person, place, and time.  Skin: Skin is warm and dry. No rash noted.  Psychiatric: She has a normal mood and affect. Her behavior is normal.  Vitals reviewed.       Assessment & Plan:     1. Cough 2. Reactive airway disease without complication, unspecified asthma severity, unspecified whether persistent - no evidence of bacterial infection, such as CAP, AOM, sinusitis, strep throat - discussed natural course, symptomatic management, and return precautions - no indication for antibiotics  - could consider prednisone if wheezing and SOB worsens later this week, but would avoid if possible given UC - could consider PFTs if continues to have chronic cough/wheezing in the future - can use albuterol prn for cough/wheezing    Meds ordered this encounter  Medications  . albuterol (PROVENTIL HFA;VENTOLIN HFA) 108 (90 Base) MCG/ACT inhaler    Sig: Inhale 2 puffs into the lungs every 6 (six) hours as needed for wheezing or shortness of breath.    Dispense:  1 Inhaler    Refill:  2  . chlorpheniramine-HYDROcodone (TUSSIONEX PENNKINETIC ER) 10-8 MG/5ML SUER    Sig: Take 5 mLs by mouth every 12 (twelve) hours as needed for cough.    Dispense:  115 mL    Refill:  0     Return if symptoms worsen or fail to improve.   The entirety of the information documented in the History of Present Illness, Review of Systems and Physical Exam were personally obtained by me. Portions of this information were initially documented by Raquel Sarna Ratchford, CMA and reviewed by me for thoroughness and accuracy.    Virginia Crews, MD, MPH [facility] 08/06/2017  10:12 AM

## 2017-08-15 ENCOUNTER — Telehealth: Payer: Self-pay | Admitting: Family Medicine

## 2017-08-15 NOTE — Telephone Encounter (Signed)
error 

## 2017-08-16 ENCOUNTER — Other Ambulatory Visit: Payer: Self-pay

## 2017-08-16 DIAGNOSIS — G4700 Insomnia, unspecified: Secondary | ICD-10-CM

## 2017-08-16 NOTE — Telephone Encounter (Signed)
Pharmacy requesting refill on medication.

## 2017-08-17 MED ORDER — TRAZODONE HCL 100 MG PO TABS: 100.0000 mg | ORAL_TABLET | Freq: Every day | ORAL | 3 refills | Status: DC

## 2017-10-01 ENCOUNTER — Other Ambulatory Visit: Payer: Self-pay | Admitting: Family Medicine

## 2017-10-01 DIAGNOSIS — F988 Other specified behavioral and emotional disorders with onset usually occurring in childhood and adolescence: Secondary | ICD-10-CM

## 2017-10-01 DIAGNOSIS — J309 Allergic rhinitis, unspecified: Secondary | ICD-10-CM

## 2017-10-01 MED ORDER — FLUTICASONE PROPIONATE 50 MCG/ACT NA SUSP: 2.0000 | Freq: Every day | NASAL | 5 refills | Status: DC

## 2017-10-01 MED ORDER — AMPHETAMINE-DEXTROAMPHETAMINE 20 MG PO TABS: 20.0000 mg | ORAL_TABLET | ORAL | 0 refills | Status: DC

## 2017-10-01 NOTE — Telephone Encounter (Signed)
Pt requesting refill of Adderall 20 MG and Flonase 50 MCG/ACT called into [facility].

## 2017-10-05 DIAGNOSIS — Z9889 Other specified postprocedural states: Secondary | ICD-10-CM | POA: Insufficient documentation

## 2017-10-12 ENCOUNTER — Encounter: Payer: Self-pay | Admitting: Family Medicine

## 2017-10-12 ENCOUNTER — Ambulatory Visit (INDEPENDENT_AMBULATORY_CARE_PROVIDER_SITE_OTHER): Payer: Managed Care, Other (non HMO) | Admitting: Family Medicine

## 2017-10-12 VITALS — BP 122/62 | HR 69 | Temp 98.2°F | Resp 16 | Ht 64.0 in | Wt 181.0 lb

## 2017-10-12 DIAGNOSIS — Z1231 Encounter for screening mammogram for malignant neoplasm of breast: Secondary | ICD-10-CM | POA: Diagnosis not present

## 2017-10-12 DIAGNOSIS — Z0000 Encounter for general adult medical examination without abnormal findings: Secondary | ICD-10-CM | POA: Diagnosis not present

## 2017-10-12 DIAGNOSIS — Z1239 Encounter for other screening for malignant neoplasm of breast: Secondary | ICD-10-CM

## 2017-10-12 DIAGNOSIS — K5150 Left sided colitis without complications: Secondary | ICD-10-CM | POA: Diagnosis not present

## 2017-10-12 NOTE — Patient Instructions (Signed)
Preventive Care 40-64 Years, Female Preventive care refers to lifestyle choices and visits with your health care provider that can promote health and wellness. What does preventive care include?  A yearly physical exam. This is also called an annual well check.  Dental exams once or twice a year.  Routine eye exams. Ask your health care provider how often you should have your eyes checked.  Personal lifestyle choices, including: ? Daily care of your teeth and gums. ? Regular physical activity. ? Eating a healthy diet. ? Avoiding tobacco and drug use. ? Limiting alcohol use. ? Practicing safe sex. ? Taking low-dose aspirin daily starting at age 58. ? Taking vitamin and mineral supplements as recommended by your health care provider. What happens during an annual well check? The services and screenings done by your health care provider during your annual well check will depend on your age, overall health, lifestyle risk factors, and family history of disease. Counseling Your health care provider may ask you questions about your:  Alcohol use.  Tobacco use.  Drug use.  Emotional well-being.  Home and relationship well-being.  Sexual activity.  Eating habits.  Work and work Statistician.  Method of birth control.  Menstrual cycle.  Pregnancy history.  Screening You may have the following tests or measurements:  Height, weight, and BMI.  Blood pressure.  Lipid and cholesterol levels. These may be checked every 5 years, or more frequently if you are over 81 years old.  Skin check.  Lung cancer screening. You may have this screening every year starting at age 78 if you have a 30-pack-year history of smoking and currently smoke or have quit within the past 15 years.  Fecal occult blood test (FOBT) of the stool. You may have this test every year starting at age 65.  Flexible sigmoidoscopy or colonoscopy. You may have a sigmoidoscopy every 5 years or a colonoscopy  every 10 years starting at age 30.  Hepatitis C blood test.  Hepatitis B blood test.  Sexually transmitted disease (STD) testing.  Diabetes screening. This is done by checking your blood sugar (glucose) after you have not eaten for a while (fasting). You may have this done every 1-3 years.  Mammogram. This may be done every 1-2 years. Talk to your health care provider about when you should start having regular mammograms. This may depend on whether you have a family history of breast cancer.  BRCA-related cancer screening. This may be done if you have a family history of breast, ovarian, tubal, or peritoneal cancers.  Pelvic exam and Pap test. This may be done every 3 years starting at age 80. Starting at age 36, this may be done every 5 years if you have a Pap test in combination with an HPV test.  Bone density scan. This is done to screen for osteoporosis. You may have this scan if you are at high risk for osteoporosis.  Discuss your test results, treatment options, and if necessary, the need for more tests with your health care provider. Vaccines Your health care provider may recommend certain vaccines, such as:  Influenza vaccine. This is recommended every year.  Tetanus, diphtheria, and acellular pertussis (Tdap, Td) vaccine. You may need a Td booster every 10 years.  Varicella vaccine. You may need this if you have not been vaccinated.  Zoster vaccine. You may need this after age 5.  Measles, mumps, and rubella (MMR) vaccine. You may need at least one dose of MMR if you were born in  1957 or later. You may also need a second dose.  Pneumococcal 13-valent conjugate (PCV13) vaccine. You may need this if you have certain conditions and were not previously vaccinated.  Pneumococcal polysaccharide (PPSV23) vaccine. You may need one or two doses if you smoke cigarettes or if you have certain conditions.  Meningococcal vaccine. You may need this if you have certain  conditions.  Hepatitis A vaccine. You may need this if you have certain conditions or if you travel or work in places where you may be exposed to hepatitis A.  Hepatitis B vaccine. You may need this if you have certain conditions or if you travel or work in places where you may be exposed to hepatitis B.  Haemophilus influenzae type b (Hib) vaccine. You may need this if you have certain conditions.  Talk to your health care provider about which screenings and vaccines you need and how often you need them. This information is not intended to replace advice given to you by your health care provider. Make sure you discuss any questions you have with your health care provider. Document Released: 06/18/2015 Document Revised: 02/09/2016 Document Reviewed: 03/23/2015 Elsevier Interactive Patient Education  2018 [employer]  

## 2017-10-12 NOTE — Progress Notes (Signed)
baci       Patient: Alison Kramer, Female    DOB: March 17, 1956, 62 y.o.   MRN: [id number] Visit Date: 10/12/2017  Today's Provider: Lavon Paganini, MD   I, Martha Clan, CMA, am acting as scribe for Lavon Paganini, MD.  Chief Complaint  Patient presents with  . Annual Exam   Subjective:    Annual physical exam Alison Kramer is a 62 y.o. female who presents today for health maintenance and complete physical. She feels well. She reports she is not currently exercising. She reports she is sleeping fairly well. She is currently on Prednisone, which is causing decreased sleep.  Last colonoscopy- 08/14/2014- hyperplastic polyp and ulcerative colitis.  Dr. Cephas Darby at [facility] completed another colonoscopy 10/05/17. Showed worsening UC.  Now on high dose daily prednisone.  Considering biologic therapy.  Will update in our system. Last mammogram- 10/07/2015- BI-RADS 1 Last pap- 10/11/2012- negative with no endocervical cells present. S/P hysterectomy in 1982 due to uterine prolapse.  -----------------------------------------------------------------   Review of Systems  Constitutional: Positive for fatigue. Negative for activity change, appetite change, chills, diaphoresis, fever and unexpected weight change.  HENT: Positive for postnasal drip and rhinorrhea. Negative for congestion, dental problem, drooling, ear discharge, ear pain, facial swelling, hearing loss, mouth sores, nosebleeds, sinus pressure, sinus pain, sneezing, sore throat, tinnitus, trouble swallowing and voice change.   Eyes: Negative.   Respiratory: Positive for cough. Negative for apnea, choking, chest tightness, shortness of breath, wheezing and stridor.   Cardiovascular: Positive for leg swelling. Negative for chest pain and palpitations.  Gastrointestinal: Negative.   Endocrine: Negative.   Genitourinary: Negative.   Musculoskeletal: Positive for arthralgias and back pain. Negative for gait problem, joint swelling,  myalgias, neck pain and neck stiffness.  Skin: Negative.   Allergic/Immunologic: Positive for environmental allergies. Negative for food allergies and immunocompromised state.  Neurological: Negative.   Hematological: Negative.   Psychiatric/Behavioral: Positive for self-injury. Negative for agitation, behavioral problems, confusion, decreased concentration, dysphoric mood, hallucinations, sleep disturbance and suicidal ideas. The patient is not nervous/anxious and is not hyperactive.     Social History      She  reports that she has never smoked. She has never used smokeless tobacco. She reports that she drinks alcohol. She reports that she does not use drugs.       Social History   Socioeconomic History  . Marital status: Married    Spouse name: Sam  . Number of children: 2  . Years of education: 37  . Highest education level: Bachelor's degree (e.g., BA, AB, BS)  Occupational History  . Occupation: Recruitment consultant: [employer]  . Financial resource strain: Not on file  . Food insecurity:    Worry: Not on file    Inability: Not on file  . Transportation needs:    Medical: Not on file    Non-medical: Not on file  Tobacco Use  . Smoking status: Never Smoker  . Smokeless tobacco: Never Used  Substance and Sexual Activity  . Alcohol use: Yes    Alcohol/week: 0.0 oz    Comment: occasional  . Drug use: No  . Sexual activity: Yes    Birth control/protection: None  Lifestyle  . Physical activity:    Days per week: Not on file    Minutes per session: Not on file  . Stress: Not on file  Relationships  . Social connections:    Talks on phone: Not on file  Gets together: Not on file    Attends religious service: Not on file    Active member of club or organization: Not on file    Attends meetings of clubs or organizations: Not on file    Relationship status: Not on file  Other Topics Concern  . Not on file  Social History Narrative  . Not on file      Past Medical History:  Diagnosis Date  . ADHD (attention deficit hyperactivity disorder)   . Anxiety   . Colitis, ulcerative ([place])   . Depression      Patient Active Problem List   Diagnosis Date Noted  . Insomnia 10/12/2015  . Allergic rhinitis 09/29/2015  . ADD (attention deficit disorder) 12/10/2014  . Chronic left-sided ulcerative colitis ([place]) 11/22/2011  . Acute or subacute iridocyclitis 03/01/2011  . Colitis gravis ([place]) 03/01/2011    Past Surgical History:  Procedure Laterality Date  . ABDOMINAL HYSTERECTOMY  1981  . BACK SURGERY    . BUNIONECTOMY      Family History        Family Status  Relation Name Status  . Mother  Alive  . Father  Deceased at age 16       Last April 17, 2015 From Kidney failure and heart failure  . Brother  Alive  . Brother  Alive  . Brother  Alive  . Brother  Alive  . Neg Hx  (Not Specified)        Her family history includes Breast cancer in her mother; CAD in her father; Diabetes in her brother; Heart Problems in her brother and father; Hyperlipidemia in her mother; Hypertension in her brother; Obesity in her brother; Pancreatic disease in her father. There is no history of Colon cancer.      No Known Allergies   Current Outpatient Medications:  .  amphetamine-dextroamphetamine (ADDERALL) 20 MG tablet, Take 1 tablet (20 mg total) by mouth every morning., Disp: 30 tablet, Rfl: 0 .  cetirizine (ZYRTEC) 10 MG tablet, Take 1 tablet (10 mg total) by mouth daily., Disp: 90 tablet, Rfl: 1 .  cholecalciferol (VITAMIN D) 1000 UNITS tablet, Take by mouth., Disp: , Rfl:  .  diphenhydrAMINE (BENADRYL) 25 MG tablet, Take 25 mg by mouth at bedtime as needed., Disp: , Rfl:  .  fluticasone (FLONASE) 50 MCG/ACT nasal spray, Place 2 sprays into both nostrils daily., Disp: 16 g, Rfl: 5 .  mesalamine (LIALDA) 1.2 g EC tablet, Take by mouth daily with breakfast., Disp: , Rfl:  .  Omega-3 Fatty Acids (FISH OIL PO), Take by mouth., Disp: , Rfl:  .   PREDNISONE, PAK, PO, Take by mouth., Disp: , Rfl:  .  Probiotic Product (PROBIOTIC ADVANCED PO), Take by mouth., Disp: , Rfl:  .  SUMAtriptan (IMITREX) 50 MG tablet, Take 1 tablet (50 mg total) by mouth once. Repeat in 2 hours if headache persists or recurs. For a total of 147m in 24hrs, Disp: 10 tablet, Rfl: 0 .  traZODone (DESYREL) 100 MG tablet, Take 1 tablet (100 mg total) by mouth at bedtime., Disp: 90 tablet, Rfl: 3 .  albuterol (PROVENTIL HFA;VENTOLIN HFA) 108 (90 Base) MCG/ACT inhaler, Inhale 2 puffs into the lungs every 6 (six) hours as needed for wheezing or shortness of breath. (Patient not taking: Reported on 10/12/2017), Disp: 1 Inhaler, Rfl: 2   Patient Care Team: BVirginia Crews MD as PCP - General (Family Medicine)      Objective:   Vitals: BP 122/62 (BP  Location: Left Arm, Patient Position: Sitting, Cuff Size: Large)   Pulse 69   Temp 98.2 F (36.8 C) (Oral)   Resp 16   Ht 5' 4"  (1.626 m)   Wt 181 lb (82.1 kg)   SpO2 97%   BMI 31.07 kg/m    Vitals:   10/12/17 1506  BP: 122/62  Pulse: 69  Resp: 16  Temp: 98.2 F (36.8 C)  TempSrc: Oral  SpO2: 97%  Weight: 181 lb (82.1 kg)  Height: 5' 4"  (1.626 m)     Physical Exam  Constitutional: She is oriented to person, place, and time. She appears well-developed and well-nourished. No distress.  HENT:  Head: Normocephalic and atraumatic.  Right Ear: External ear normal.  Left Ear: External ear normal.  Nose: Nose normal.  Mouth/Throat: Oropharynx is clear and moist.  Eyes: Pupils are equal, round, and reactive to light. Conjunctivae and EOM are normal. No scleral icterus.  Neck: Neck supple. No thyromegaly present.  Cardiovascular: Normal rate, regular rhythm, normal heart sounds and intact distal pulses.  No murmur heard. Pulmonary/Chest: Effort normal and breath sounds normal. No respiratory distress. She has no wheezes. She has no rales.  Abdominal: Soft. Bowel sounds are normal. She exhibits no  distension. There is no tenderness.  Genitourinary:  Genitourinary Comments: Breasts: breasts appear normal, no suspicious masses, no skin or nipple changes or axillary nodes.  Musculoskeletal: She exhibits no edema or deformity.  Lymphadenopathy:    She has no cervical adenopathy.  Neurological: She is alert and oriented to person, place, and time.  Skin: Skin is warm and dry. Capillary refill takes less than 2 seconds. No rash noted.  Psychiatric: She has a normal mood and affect. Her behavior is normal.  Vitals reviewed.    Depression Screen PHQ 2/9 Scores 10/12/2017 10/10/2016  PHQ - 2 Score 1 0  PHQ- 9 Score - 3      Assessment & Plan:     Routine Health Maintenance and Physical Exam  Exercise Activities and Dietary recommendations Goals    None      Immunization History  Administered Date(s) Administered  . Influenza,inj,Quad PF,6+ Mos 04/12/2015  . Td 02/14/2008  . Tdap 02/14/2008  . Zoster 05/12/2011    Health Maintenance  Topic Date Due  . MAMMOGRAM  04/06/2017  . INFLUENZA VACCINE  01/03/2018  . TETANUS/TDAP  02/13/2018  . COLONOSCOPY  08/06/2024  . Hepatitis C Screening  Completed  . HIV Screening  Completed     Discussed health benefits of physical activity, and encouraged her to engage in regular exercise appropriate for her age and condition.    -------------------------------------------------------------------- Problem List Items Addressed This Visit      Digestive   Chronic left-sided ulcerative colitis ([place])   Relevant Orders   Comprehensive metabolic panel   CBC    Other Visit Diagnoses    Encounter for annual physical exam    -  Primary   Relevant Orders   Lipid panel   Comprehensive metabolic panel   CBC   MS DIGITAL SCREENING TOMO BILATERAL   Screening for breast cancer       Relevant Orders   MS DIGITAL SCREENING TOMO BILATERAL       Return in about 1 year (around 10/13/2018) for CPE.   The entirety of the information  documented in the History of Present Illness, Review of Systems and Physical Exam were personally obtained by me. Portions of this information were initially documented by Martha Clan, CMA  and reviewed by me for thoroughness and accuracy.    Virginia Crews, MD, MPH [facility] 10/12/2017 3:54 PM

## 2017-10-16 ENCOUNTER — Telehealth: Payer: Self-pay

## 2017-10-16 LAB — COMPREHENSIVE METABOLIC PANEL
A/G RATIO: 1.7 (ref 1.2–2.2)
ALT: 17 IU/L (ref 0–32)
AST: 16 IU/L (ref 0–40)
Albumin: 3.9 g/dL (ref 3.6–4.8)
Alkaline Phosphatase: 39 IU/L (ref 39–117)
BUN/Creatinine Ratio: 23 (ref 12–28)
BUN: 22 mg/dL (ref 8–27)
Bilirubin Total: 0.2 mg/dL (ref 0.0–1.2)
CALCIUM: 8.8 mg/dL (ref 8.7–10.3)
CO2: 23 mmol/L (ref 20–29)
CREATININE: 0.97 mg/dL (ref 0.57–1.00)
Chloride: 110 mmol/L — ABNORMAL HIGH (ref 96–106)
GFR calc Af Amer: 72 mL/min/{1.73_m2} (ref >59)
GFR, EST NON AFRICAN AMERICAN: 63 mL/min/{1.73_m2} (ref >59)
Globulin, Total: 2.3 g/dL (ref 1.5–4.5)
Glucose: 78 mg/dL (ref 65–99)
POTASSIUM: 4.3 mmol/L (ref 3.5–5.2)
Sodium: 145 mmol/L — ABNORMAL HIGH (ref 134–144)
Total Protein: 6.2 g/dL (ref 6.0–8.5)

## 2017-10-16 LAB — LIPID PANEL
CHOL/HDL RATIO: 2.8 ratio (ref 0.0–4.4)
Cholesterol, Total: 181 mg/dL (ref 100–199)
HDL: 64 mg/dL (ref >39)
LDL CALC: 97 mg/dL (ref 0–99)
Triglycerides: 102 mg/dL (ref 0–149)
VLDL Cholesterol Cal: 20 mg/dL (ref 5–40)

## 2017-10-16 LAB — CBC
Hematocrit: 37 % (ref 34.0–46.6)
Hemoglobin: 12.2 g/dL (ref 11.1–15.9)
MCH: 31.9 pg (ref 26.6–33.0)
MCHC: 33 g/dL (ref 31.5–35.7)
MCV: 97 fL (ref 79–97)
Platelets: 426 10*3/uL — ABNORMAL HIGH (ref 150–379)
RBC: 3.83 x10E6/uL (ref 3.77–5.28)
RDW: 14.6 % (ref 12.3–15.4)
WBC: 15.3 10*3/uL — ABNORMAL HIGH (ref 3.4–10.8)

## 2017-10-16 NOTE — Telephone Encounter (Signed)
[id number] 

## 2017-10-16 NOTE — Telephone Encounter (Signed)
Advised patient of results.  

## 2017-10-16 NOTE — Telephone Encounter (Signed)
-----   Message from Virginia Crews, MD sent at 10/16/2017 11:11 AM EDT ----- Normal cholesterol, kidney function, liver function.  Sodium is slightly high, wonder if patient is slightly dehydrated.  WBC count also slightly elevated. Could be related to any recent illness.  We will recheck at next visit.  Virginia Crews, MD, MPH [facility] 10/16/2017 11:11 AM

## 2017-10-18 ENCOUNTER — Ambulatory Visit: Payer: Managed Care, Other (non HMO) | Admitting: Family Medicine

## 2017-10-18 ENCOUNTER — Encounter: Payer: Self-pay | Admitting: Family Medicine

## 2017-10-18 VITALS — BP 116/74 | HR 78 | Temp 98.2°F | Resp 16 | Wt 181.0 lb

## 2017-10-18 DIAGNOSIS — L0291 Cutaneous abscess, unspecified: Secondary | ICD-10-CM | POA: Diagnosis not present

## 2017-10-18 MED ORDER — CEPHALEXIN 500 MG PO CAPS: 500.0000 mg | ORAL_CAPSULE | Freq: Two times a day (BID) | ORAL | 0 refills | Status: AC

## 2017-10-18 NOTE — Progress Notes (Signed)
Patient: Alison Kramer Female    DOB: April 19, 1956   62 y.o.   MRN: [id number] Visit Date: 10/18/2017  Today's Provider: Lavon Paganini, MD   I, Martha Clan, CMA, am acting as scribe for Lavon Paganini, MD.  Chief Complaint  Patient presents with  . Skin Problem   Subjective:    HPI   Pt states she noticed a "bump" on her genital area yesterday. This is becoming more sore. Pt wonders if this could be an ingrown hair.  She is unsure if it is red.  She denies any drainage or fevers and she is never had anything like this before.  She is sexually active but only with her husband.  She denies any risk of STDs.  No Known Allergies   Current Outpatient Medications:  .  albuterol (PROVENTIL HFA;VENTOLIN HFA) 108 (90 Base) MCG/ACT inhaler, Inhale 2 puffs into the lungs every 6 (six) hours as needed for wheezing or shortness of breath. (Patient not taking: Reported on 10/12/2017), Disp: 1 Inhaler, Rfl: 2 .  amphetamine-dextroamphetamine (ADDERALL) 20 MG tablet, Take 1 tablet (20 mg total) by mouth every morning., Disp: 30 tablet, Rfl: 0 .  cetirizine (ZYRTEC) 10 MG tablet, Take 1 tablet (10 mg total) by mouth daily., Disp: 90 tablet, Rfl: 1 .  cholecalciferol (VITAMIN D) 1000 UNITS tablet, Take by mouth., Disp: , Rfl:  .  diphenhydrAMINE (BENADRYL) 25 MG tablet, Take 25 mg by mouth at bedtime as needed., Disp: , Rfl:  .  fluticasone (FLONASE) 50 MCG/ACT nasal spray, Place 2 sprays into both nostrils daily., Disp: 16 g, Rfl: 5 .  mesalamine (LIALDA) 1.2 g EC tablet, Take by mouth daily with breakfast., Disp: , Rfl:  .  Omega-3 Fatty Acids (FISH OIL PO), Take by mouth., Disp: , Rfl:  .  PREDNISONE, PAK, PO, Take by mouth., Disp: , Rfl:  .  Probiotic Product (PROBIOTIC ADVANCED PO), Take by mouth., Disp: , Rfl:  .  SUMAtriptan (IMITREX) 50 MG tablet, Take 1 tablet (50 mg total) by mouth once. Repeat in 2 hours if headache persists or recurs. For a total of 126m in 24hrs, Disp: 10  tablet, Rfl: 0 .  traZODone (DESYREL) 100 MG tablet, Take 1 tablet (100 mg total) by mouth at bedtime., Disp: 90 tablet, Rfl: 3  Review of Systems  Constitutional: Negative for activity change, appetite change, chills, diaphoresis, fatigue, fever and unexpected weight change.  Cardiovascular: Negative for chest pain, palpitations and leg swelling.  Genitourinary: Negative for hematuria, pelvic pain, vaginal bleeding, vaginal discharge and vaginal pain.    Social History   Tobacco Use  . Smoking status: Never Smoker  . Smokeless tobacco: Never Used  Substance Use Topics  . Alcohol use: Yes    Alcohol/week: 0.0 oz    Comment: occasional   Objective:   BP 116/74 (BP Location: Left Arm, Patient Position: Sitting, Cuff Size: Normal)   Pulse 78   Temp 98.2 F (36.8 C) (Oral)   Resp 16   Wt 181 lb (82.1 kg)   SpO2 98%   BMI 31.07 kg/m  Vitals:   10/18/17 1546  BP: 116/74  Pulse: 78  Resp: 16  Temp: 98.2 F (36.8 C)  TempSrc: Oral  SpO2: 98%  Weight: 181 lb (82.1 kg)     Physical Exam  Constitutional: She is oriented to person, place, and time. She appears well-developed and well-nourished. No distress.  HENT:  Head: Normocephalic and atraumatic.  Eyes: Conjunctivae are normal.  Cardiovascular: Normal rate and regular rhythm.  Pulmonary/Chest: Effort normal. No respiratory distress.  Genitourinary:  Genitourinary Comments: Small indurated area over upper left labia majora without fluctuance or drainage.  Neurological: She is alert and oriented to person, place, and time.  Skin: Capillary refill takes less than 2 seconds.  Psychiatric: She has a normal mood and affect. Her behavior is normal.        Assessment & Plan:      1. Abscess -Exam consistent with small abscess of labia majora - No fluctuance and therefore nothing to drain -Advised sitz bath's -Treat with 7-day course of Keflex -Discussed return precautions  Meds ordered this encounter  Medications    . cephALEXin (KEFLEX) 500 MG capsule    Sig: Take 1 capsule (500 mg total) by mouth 2 (two) times daily for 7 days.    Dispense:  14 capsule    Refill:  0     Return if symptoms worsen or fail to improve.   The entirety of the information documented in the History of Present Illness, Review of Systems and Physical Exam were personally obtained by me. Portions of this information were initially documented by Raquel Sarna Ratchford, CMA and reviewed by me for thoroughness and accuracy.    Virginia Crews, MD, MPH [facility] 10/18/2017 4:50 PM

## 2017-10-18 NOTE — Patient Instructions (Signed)

## 2017-12-11 ENCOUNTER — Encounter: Payer: Self-pay | Admitting: Family Medicine

## 2017-12-11 ENCOUNTER — Ambulatory Visit: Payer: Managed Care, Other (non HMO) | Admitting: Family Medicine

## 2017-12-11 VITALS — BP 120/68 | HR 92 | Temp 98.4°F | Resp 16 | Wt 188.0 lb

## 2017-12-11 DIAGNOSIS — F988 Other specified behavioral and emotional disorders with onset usually occurring in childhood and adolescence: Secondary | ICD-10-CM | POA: Diagnosis not present

## 2017-12-11 DIAGNOSIS — K5150 Left sided colitis without complications: Secondary | ICD-10-CM | POA: Diagnosis not present

## 2017-12-11 DIAGNOSIS — Z23 Encounter for immunization: Secondary | ICD-10-CM

## 2017-12-11 MED ORDER — AMPHETAMINE-DEXTROAMPHETAMINE 20 MG PO TABS: 20.0000 mg | ORAL_TABLET | Freq: Every day | ORAL | 0 refills | Status: DC

## 2017-12-11 MED ORDER — AMPHETAMINE-DEXTROAMPHETAMINE 20 MG PO TABS: 20.0000 mg | ORAL_TABLET | ORAL | 0 refills | Status: DC

## 2017-12-11 NOTE — Progress Notes (Signed)
Patient: Alison Kramer Female    DOB: December 15, 1955   63 y.o.   MRN: [id number] Visit Date: 12/11/2017  Today's Provider: Lavon Paganini, MD   Chief Complaint  Patient presents with  . Ulcerative Colitis   Subjective:    HPI    Pt recently saw her GI, who wants to start pt on immunosuppressant therapy for her UC. Pt had antibodies checked for Hep B, this showed she is not immune. She was advised to start this vaccine series with her PCP. She is currently on Prednisone.  She continues to be symptomatic.  She has not had TB exposure.  She received Zostavax in 2012, but has not gotten Shingrix.   Follow up for ADD  Pt needs a refill of her Adderall. Reports good compliance with treatment, and good sx control. Only takes this medication on workdays. Denies side effects, such as weight loss, insomnia, palpitations, tachycardia, high BP, headaches.  ------------------------------------------------------------------------------------     No Known Allergies   Current Outpatient Medications:  .  amphetamine-dextroamphetamine (ADDERALL) 20 MG tablet, Take 1 tablet (20 mg total) by mouth every morning., Disp: 30 tablet, Rfl: 0 .  cetirizine (ZYRTEC) 10 MG tablet, Take 1 tablet (10 mg total) by mouth daily., Disp: 90 tablet, Rfl: 1 .  cholecalciferol (VITAMIN D) 1000 UNITS tablet, Take by mouth., Disp: , Rfl:  .  diphenhydrAMINE (BENADRYL) 25 MG tablet, Take 25 mg by mouth at bedtime as needed., Disp: , Rfl:  .  fluticasone (FLONASE) 50 MCG/ACT nasal spray, Place 2 sprays into both nostrils daily., Disp: 16 g, Rfl: 5 .  mesalamine (LIALDA) 1.2 g EC tablet, Take by mouth daily with breakfast., Disp: , Rfl:  .  Omega-3 Fatty Acids (FISH OIL PO), Take by mouth., Disp: , Rfl:  .  PREDNISONE, PAK, PO, Take by mouth., Disp: , Rfl:  .  Probiotic Product (PROBIOTIC ADVANCED PO), Take by mouth., Disp: , Rfl:  .  traZODone (DESYREL) 100 MG tablet, Take 1 tablet (100 mg total) by mouth at  bedtime., Disp: 90 tablet, Rfl: 3 .  albuterol (PROVENTIL HFA;VENTOLIN HFA) 108 (90 Base) MCG/ACT inhaler, Inhale 2 puffs into the lungs every 6 (six) hours as needed for wheezing or shortness of breath. (Patient not taking: Reported on 10/12/2017), Disp: 1 Inhaler, Rfl: 2 .  amphetamine-dextroamphetamine (ADDERALL) 20 MG tablet, Take 1 tablet (20 mg total) by mouth daily., Disp: 30 tablet, Rfl: 0 .  amphetamine-dextroamphetamine (ADDERALL) 20 MG tablet, Take 1 tablet (20 mg total) by mouth daily., Disp: 30 tablet, Rfl: 0 .  SUMAtriptan (IMITREX) 50 MG tablet, Take 1 tablet (50 mg total) by mouth once. Repeat in 2 hours if headache persists or recurs. For a total of 154m in 24hrs, Disp: 10 tablet, Rfl: 0  Review of Systems  Constitutional: Negative for activity change, appetite change, chills, diaphoresis, fatigue, fever and unexpected weight change.  Psychiatric/Behavioral: Positive for decreased concentration.    Social History   Tobacco Use  . Smoking status: Never Smoker  . Smokeless tobacco: Never Used  Substance Use Topics  . Alcohol use: Yes    Alcohol/week: 0.0 oz    Comment: occasional   Objective:   BP 120/68 (BP Location: Left Arm, Patient Position: Sitting, Cuff Size: Large)   Pulse 92   Temp 98.4 F (36.9 C) (Oral)   Resp 16   Wt 188 lb (85.3 kg)   SpO2 99%   BMI 32.27 kg/m  Vitals:   12/11/17  0947  BP: 120/68  Pulse: 92  Resp: 16  Temp: 98.4 F (36.9 C)  TempSrc: Oral  SpO2: 99%  Weight: 188 lb (85.3 kg)     Physical Exam  Constitutional: She is oriented to person, place, and time. She appears well-developed and well-nourished. No distress.  HENT:  Head: Normocephalic and atraumatic.  Eyes: Conjunctivae are normal. No scleral icterus.  Neck: Neck supple. No thyromegaly present.  Cardiovascular: Normal rate, regular rhythm, normal heart sounds and intact distal pulses.  No murmur heard. Pulmonary/Chest: Effort normal and breath sounds normal. No  respiratory distress. She has no wheezes. She has no rales.  Abdominal: Soft. She exhibits no distension. There is no tenderness.  Musculoskeletal: She exhibits no edema.  Lymphadenopathy:    She has no cervical adenopathy.  Neurological: She is alert and oriented to person, place, and time.  Skin: Skin is warm and dry. Capillary refill takes less than 2 seconds. No rash noted.  Psychiatric: She has a normal mood and affect. Her behavior is normal.  Vitals reviewed.      Assessment & Plan:   Problem List Items Addressed This Visit      Digestive   Chronic left-sided ulcerative colitis ([place])    Followed by GI Letter from gastroenterologist [facility], Dr. Teresa Pelton, reviewed today with the patient. GI is recommending starting an immunosuppressive agent for treatment of ulcerative colitis As patient is not immune to hepatitis B, she is recommending hepatitis B immunization Patient is concerned about immunosuppression and needing to stay away from other people Encouraged her to discuss with GI at upcoming appointment We will start hepatitis B vaccinations today and schedule follow-ups for next 2 the series I am also recommending the patient get Shingrix prior to starting immunosuppression She will check with her insurance about coverage of this vaccine and we will start with her next hepatitis B vaccination      Relevant Orders   Varicella-zoster vaccine IM (Completed)   Hepatitis B vaccine adult IM (Completed)     Other   ADD (attention deficit disorder) - Primary    Symptoms are well controlled on current Adderall dose She is only taking Adderall on workdays and not when she is sick  refilled Adderall x3 months Advised follow-up at least every 6 months for refills      Relevant Medications   amphetamine-dextroamphetamine (ADDERALL) 20 MG tablet    Other Visit Diagnoses    Need for hepatitis B vaccination       Relevant Orders   Hepatitis B vaccine adult IM (Completed)    Need for shingles vaccine       Relevant Orders   Varicella-zoster vaccine IM (Completed)       Return in about 1 month (around 01/11/2018) for next Hep B vaccine and Shingrix.   The entirety of the information documented in the History of Present Illness, Review of Systems and Physical Exam were personally obtained by me. Portions of this information were initially documented by Raquel Sarna Ratchford, CMA and reviewed by me for thoroughness and accuracy.    Virginia Crews, MD, MPH [facility] 12/11/2017 11:52 AM

## 2017-12-11 NOTE — Patient Instructions (Signed)
   The CDC recommends two doses of Shingrix (the shingles vaccine) separated by 2 to 6 months for adults age 63 years and older. I recommend checking with your insurance plan regarding coverage for this vaccine.

## 2017-12-11 NOTE — Assessment & Plan Note (Signed)
Symptoms are well controlled on current Adderall dose She is only taking Adderall on workdays and not when she is sick  refilled Adderall x3 months Advised follow-up at least every 6 months for refills

## 2017-12-11 NOTE — Assessment & Plan Note (Addendum)
Followed by GI Letter from gastroenterologist [facility], Dr. Teresa Pelton, reviewed today with the patient. GI is recommending starting an immunosuppressive agent for treatment of ulcerative colitis As patient is not immune to hepatitis B, she is recommending hepatitis B immunization Patient is concerned about immunosuppression and needing to stay away from other people Encouraged her to discuss with GI at upcoming appointment We will start hepatitis B vaccinations today and schedule follow-ups for next 2 the series I am also recommending the patient get Shingrix prior to starting immunosuppression She will check with her insurance about coverage of this vaccine and we will start with her next hepatitis B vaccination

## 2018-01-08 ENCOUNTER — Ambulatory Visit (INDEPENDENT_AMBULATORY_CARE_PROVIDER_SITE_OTHER): Payer: Managed Care, Other (non HMO) | Admitting: Family Medicine

## 2018-01-08 DIAGNOSIS — Z23 Encounter for immunization: Secondary | ICD-10-CM | POA: Diagnosis not present

## 2018-01-08 DIAGNOSIS — K5150 Left sided colitis without complications: Secondary | ICD-10-CM

## 2018-01-08 DIAGNOSIS — Z7952 Long term (current) use of systemic steroids: Secondary | ICD-10-CM

## 2018-01-08 DIAGNOSIS — Z780 Asymptomatic menopausal state: Secondary | ICD-10-CM

## 2018-01-08 NOTE — Progress Notes (Signed)
Pt presents for 2nd Hep B vaccine only. She is currently taking Prednisone long term for UC. She also is requesting an order for a Dexa scan.

## 2018-02-11 ENCOUNTER — Ambulatory Visit (INDEPENDENT_AMBULATORY_CARE_PROVIDER_SITE_OTHER): Payer: Managed Care, Other (non HMO) | Admitting: Family Medicine

## 2018-02-11 DIAGNOSIS — Z23 Encounter for immunization: Secondary | ICD-10-CM

## 2018-02-11 NOTE — Progress Notes (Signed)
Patient here only for Shingrix vaccination #2.  I did not see the patient. Vaccine was given by CMA.  Patient's history and vaccine consent form reviewed.  Virginia Crews, MD, MPH [facility] 02/11/2018 4:29 PM

## 2018-02-12 ENCOUNTER — Ambulatory Visit
Admission: RE | Admit: 2018-02-12 | Discharge: 2018-02-12 | Disposition: A | Discharge status: Home / Self Care | Payer: Managed Care, Other (non HMO) | Source: Ambulatory Visit | Attending: Family Medicine | Admitting: Family Medicine

## 2018-02-12 DIAGNOSIS — Z7952 Long term (current) use of systemic steroids: Secondary | ICD-10-CM | POA: Insufficient documentation

## 2018-02-12 DIAGNOSIS — Z780 Asymptomatic menopausal state: Secondary | ICD-10-CM | POA: Diagnosis not present

## 2018-02-13 ENCOUNTER — Telehealth: Payer: Self-pay

## 2018-02-13 NOTE — Telephone Encounter (Signed)
-----   Message from Virginia Crews, MD sent at 02/13/2018  2:43 PM EDT ----- Bone density scan shows osteopenia (this is some bone loss, but not as bad as osteoporosis).  Recommend regular weight bearing exercise, avoiding smoking, and adequate Ca (1242m/day) and Vit D (1000 units daily) via diet or supplement.  We will recheck in 2 years to ensure this hasn't worsened.  BVirginia Crews MD, MPH [facility]9/04/2018 2:43 PM

## 2018-02-13 NOTE — Telephone Encounter (Signed)
LMTCB

## 2018-02-15 NOTE — Telephone Encounter (Signed)
Patient advised.

## 2018-03-05 ENCOUNTER — Other Ambulatory Visit: Payer: Self-pay | Admitting: Family Medicine

## 2018-03-05 DIAGNOSIS — J309 Allergic rhinitis, unspecified: Secondary | ICD-10-CM

## 2018-03-05 MED ORDER — FLUTICASONE PROPIONATE 50 MCG/ACT NA SUSP: 2.0000 | Freq: Every day | NASAL | 5 refills | Status: DC

## 2018-03-05 NOTE — Telephone Encounter (Signed)
[facility] faxed refill request for the following medications:  fluticasone (FLONASE) 50 MCG/ACT nasal spray  Qty: 16  This is a Dr. Brita Romp patient.  Please advise.

## 2018-06-03 ENCOUNTER — Other Ambulatory Visit: Payer: Self-pay | Admitting: Family Medicine

## 2018-06-03 DIAGNOSIS — G4700 Insomnia, unspecified: Secondary | ICD-10-CM

## 2018-06-07 LAB — HM COLONOSCOPY

## 2018-06-14 ENCOUNTER — Ambulatory Visit: Payer: Self-pay | Admitting: Family Medicine

## 2018-06-14 NOTE — Progress Notes (Deleted)
       Patient: Alison Kramer Female    DOB: Jan 08, 1956   63 y.o.   MRN: [id number] Visit Date: 06/14/2018  Today's Provider: Lavon Paganini, MD   No chief complaint on file.  Subjective:     HPI ADD: Patient presents for a 6 month follow up. Last OV was on 12/11/2017. Patient advised to continue Adderall. She reports good compliance with treatment plan. She states symptoms are   No Known Allergies   Current Outpatient Medications:  .  albuterol (PROVENTIL HFA;VENTOLIN HFA) 108 (90 Base) MCG/ACT inhaler, Inhale 2 puffs into the lungs every 6 (six) hours as needed for wheezing or shortness of breath. (Patient not taking: Reported on 10/12/2017), Disp: 1 Inhaler, Rfl: 2 .  amphetamine-dextroamphetamine (ADDERALL) 20 MG tablet, Take 1 tablet (20 mg total) by mouth every morning., Disp: 30 tablet, Rfl: 0 .  amphetamine-dextroamphetamine (ADDERALL) 20 MG tablet, Take 1 tablet (20 mg total) by mouth daily., Disp: 30 tablet, Rfl: 0 .  amphetamine-dextroamphetamine (ADDERALL) 20 MG tablet, Take 1 tablet (20 mg total) by mouth daily., Disp: 30 tablet, Rfl: 0 .  cetirizine (ZYRTEC) 10 MG tablet, Take 1 tablet (10 mg total) by mouth daily., Disp: 90 tablet, Rfl: 1 .  cholecalciferol (VITAMIN D) 1000 UNITS tablet, Take by mouth., Disp: , Rfl:  .  diphenhydrAMINE (BENADRYL) 25 MG tablet, Take 25 mg by mouth at bedtime as needed., Disp: , Rfl:  .  fluticasone (FLONASE) 50 MCG/ACT nasal spray, Place 2 sprays into both nostrils daily., Disp: 16 g, Rfl: 5 .  mesalamine (LIALDA) 1.2 g EC tablet, Take by mouth daily with breakfast., Disp: , Rfl:  .  Omega-3 Fatty Acids (FISH OIL PO), Take by mouth., Disp: , Rfl:  .  PREDNISONE, PAK, PO, Take by mouth., Disp: , Rfl:  .  Probiotic Product (PROBIOTIC ADVANCED PO), Take by mouth., Disp: , Rfl:  .  SUMAtriptan (IMITREX) 50 MG tablet, Take 1 tablet (50 mg total) by mouth once. Repeat in 2 hours if headache persists or recurs. For a total of 178m in 24hrs,  Disp: 10 tablet, Rfl: 0 .  traZODone (DESYREL) 100 MG tablet, TAKE 1 TABLET BY MOUTH AT  BEDTIME, Disp: 90 tablet, Rfl: 3  Review of Systems  Social History   Tobacco Use  . Smoking status: Never Smoker  . Smokeless tobacco: Never Used  Substance Use Topics  . Alcohol use: Yes    Alcohol/week: 0.0 standard drinks    Comment: occasional      Objective:   There were no vitals taken for this visit. There were no vitals filed for this visit.   Physical Exam      Assessment & Plan        ALavon Paganini MD  [facility]

## 2018-06-19 ENCOUNTER — Ambulatory Visit: Payer: Self-pay | Admitting: Family Medicine

## 2018-06-19 NOTE — Progress Notes (Deleted)
       Patient: Alison Kramer Female    DOB: [birth year]   63 y.o.   MRN: [id number] Visit Date: 06/19/2018  Today's Provider: Lavon Paganini, MD   No chief complaint on file.  Subjective:     HPI ADD: Patient presents for a 6 month follow up. Last OV was on 12/11/2017. Patient advised to continue Adderall. She reports good compliance with treatment plan. She states symptoms are   No Known Allergies   Current Outpatient Medications:  .  albuterol (PROVENTIL HFA;VENTOLIN HFA) 108 (90 Base) MCG/ACT inhaler, Inhale 2 puffs into the lungs every 6 (six) hours as needed for wheezing or shortness of breath. (Patient not taking: Reported on 10/12/2017), Disp: 1 Inhaler, Rfl: 2 .  amphetamine-dextroamphetamine (ADDERALL) 20 MG tablet, Take 1 tablet (20 mg total) by mouth every morning., Disp: 30 tablet, Rfl: 0 .  amphetamine-dextroamphetamine (ADDERALL) 20 MG tablet, Take 1 tablet (20 mg total) by mouth daily., Disp: 30 tablet, Rfl: 0 .  amphetamine-dextroamphetamine (ADDERALL) 20 MG tablet, Take 1 tablet (20 mg total) by mouth daily., Disp: 30 tablet, Rfl: 0 .  cetirizine (ZYRTEC) 10 MG tablet, Take 1 tablet (10 mg total) by mouth daily., Disp: 90 tablet, Rfl: 1 .  cholecalciferol (VITAMIN D) 1000 UNITS tablet, Take by mouth., Disp: , Rfl:  .  diphenhydrAMINE (BENADRYL) 25 MG tablet, Take 25 mg by mouth at bedtime as needed., Disp: , Rfl:  .  fluticasone (FLONASE) 50 MCG/ACT nasal spray, Place 2 sprays into both nostrils daily., Disp: 16 g, Rfl: 5 .  mesalamine (LIALDA) 1.2 g EC tablet, Take by mouth daily with breakfast., Disp: , Rfl:  .  Omega-3 Fatty Acids (FISH OIL PO), Take by mouth., Disp: , Rfl:  .  PREDNISONE, PAK, PO, Take by mouth., Disp: , Rfl:  .  Probiotic Product (PROBIOTIC ADVANCED PO), Take by mouth., Disp: , Rfl:  .  SUMAtriptan (IMITREX) 50 MG tablet, Take 1 tablet (50 mg total) by mouth once. Repeat in 2 hours if headache persists or recurs. For a total of 142m in 24hrs,  Disp: 10 tablet, Rfl: 0 .  traZODone (DESYREL) 100 MG tablet, TAKE 1 TABLET BY MOUTH AT  BEDTIME, Disp: 90 tablet, Rfl: 3  Review of Systems  Constitutional: Negative.   Respiratory: Negative.   Cardiovascular: Negative.   Musculoskeletal: Negative.   Psychiatric/Behavioral: Positive for decreased concentration.    Social History   Tobacco Use  . Smoking status: Never Smoker  . Smokeless tobacco: Never Used  Substance Use Topics  . Alcohol use: Yes    Alcohol/week: 0.0 standard drinks    Comment: occasional      Objective:   There were no vitals taken for this visit. There were no vitals filed for this visit.   Physical Exam      Assessment & Plan        ALavon Paganini MD  [facility]

## 2018-06-21 ENCOUNTER — Ambulatory Visit: Payer: Managed Care, Other (non HMO) | Admitting: Family Medicine

## 2018-06-21 ENCOUNTER — Encounter: Payer: Self-pay | Admitting: Family Medicine

## 2018-06-21 VITALS — BP 110/72 | HR 79 | Temp 98.5°F | Wt 203.6 lb

## 2018-06-21 DIAGNOSIS — K5150 Left sided colitis without complications: Secondary | ICD-10-CM | POA: Diagnosis not present

## 2018-06-21 DIAGNOSIS — Z23 Encounter for immunization: Secondary | ICD-10-CM | POA: Diagnosis not present

## 2018-06-21 DIAGNOSIS — F988 Other specified behavioral and emotional disorders with onset usually occurring in childhood and adolescence: Secondary | ICD-10-CM | POA: Diagnosis not present

## 2018-06-21 NOTE — Assessment & Plan Note (Signed)
Currently well controlled Followed by GI Patient states that the immune suppressive agent that they had been considering for treatment in the past is on hold right now She is getting her last hepatitis B immunization today

## 2018-06-21 NOTE — Progress Notes (Signed)
Patient: Alison Kramer Female    DOB: 1956/03/20   63 y.o.   MRN: [id number] Visit Date: 06/21/2018  Today's Provider: Lavon Paganini, MD   No chief complaint on file.  Subjective:   I, Porsha McClurkin, CMA, am acting as a scribe for Lavon Paganini, MD.   HPI ADD: Patient presents for a 6 month follow up. Last OV was on 12/11/2017. Patient advised to continue Adderall but patient states she is not talking the medication. She reports poor  compliance with treatment plan due to not been able to sleep. She states symptoms are none. She does not want to take a medication currently  She is due for Td and 3rd Hep B vaccines today too   No Known Allergies   Current Outpatient Medications:  .  cetirizine (ZYRTEC) 10 MG tablet, Take 1 tablet (10 mg total) by mouth daily., Disp: 90 tablet, Rfl: 1 .  cholecalciferol (VITAMIN D) 1000 UNITS tablet, Take by mouth., Disp: , Rfl:  .  diphenhydrAMINE (BENADRYL) 25 MG tablet, Take 25 mg by mouth at bedtime as needed., Disp: , Rfl:  .  fluticasone (FLONASE) 50 MCG/ACT nasal spray, Place 2 sprays into both nostrils daily., Disp: 16 g, Rfl: 5 .  mesalamine (LIALDA) 1.2 g EC tablet, Take by mouth daily with breakfast., Disp: , Rfl:  .  Omega-3 Fatty Acids (FISH OIL PO), Take by mouth., Disp: , Rfl:  .  Probiotic Product (PROBIOTIC ADVANCED PO), Take by mouth., Disp: , Rfl:  .  traZODone (DESYREL) 100 MG tablet, TAKE 1 TABLET BY MOUTH AT  BEDTIME, Disp: 90 tablet, Rfl: 3 .  albuterol (PROVENTIL HFA;VENTOLIN HFA) 108 (90 Base) MCG/ACT inhaler, Inhale 2 puffs into the lungs every 6 (six) hours as needed for wheezing or shortness of breath. (Patient not taking: Reported on 10/12/2017), Disp: 1 Inhaler, Rfl: 2 .  amphetamine-dextroamphetamine (ADDERALL) 20 MG tablet, Take 1 tablet (20 mg total) by mouth every morning. (Patient not taking: Reported on 06/21/2018), Disp: 30 tablet, Rfl: 0 .  amphetamine-dextroamphetamine (ADDERALL) 20 MG tablet, Take  1 tablet (20 mg total) by mouth daily. (Patient not taking: Reported on 06/21/2018), Disp: 30 tablet, Rfl: 0 .  amphetamine-dextroamphetamine (ADDERALL) 20 MG tablet, Take 1 tablet (20 mg total) by mouth daily. (Patient not taking: Reported on 06/21/2018), Disp: 30 tablet, Rfl: 0 .  PREDNISONE, PAK, PO, Take by mouth., Disp: , Rfl:  .  SUMAtriptan (IMITREX) 50 MG tablet, Take 1 tablet (50 mg total) by mouth once. Repeat in 2 hours if headache persists or recurs. For a total of 187m in 24hrs, Disp: 10 tablet, Rfl: 0  Review of Systems  Constitutional: Negative.   HENT: Negative.   Respiratory: Negative.   Genitourinary: Negative.   Neurological: Negative.   Psychiatric/Behavioral: Negative.     Social History   Tobacco Use  . Smoking status: Never Smoker  . Smokeless tobacco: Never Used  Substance Use Topics  . Alcohol use: Yes    Alcohol/week: 0.0 standard drinks    Comment: occasional      Objective:   BP 110/72 (BP Location: Left Arm, Patient Position: Sitting, Cuff Size: Normal)   Pulse 79   Temp 98.5 F (36.9 C) (Oral)   Wt 203 lb 9.6 oz (92.4 kg)   SpO2 99%   BMI 34.95 kg/m  Vitals:   06/21/18 1458  BP: 110/72  Pulse: 79  Temp: 98.5 F (36.9 C)  TempSrc: Oral  SpO2: 99%  Weight: 203 lb 9.6 oz (92.4 kg)     Physical Exam Vitals signs reviewed.  Constitutional:      General: She is not in acute distress.    Appearance: Normal appearance.  HENT:     Head: Normocephalic and atraumatic.     Right Ear: External ear normal.     Left Ear: External ear normal.     Nose: Nose normal.     Mouth/Throat:     Pharynx: Oropharynx is clear.  Eyes:     General: No scleral icterus.    Conjunctiva/sclera: Conjunctivae normal.  Neck:     Musculoskeletal: Neck supple.  Cardiovascular:     Rate and Rhythm: Normal rate and regular rhythm.     Pulses: Normal pulses.     Heart sounds: Normal heart sounds. No murmur.  Pulmonary:     Effort: Pulmonary effort is normal.  No respiratory distress.     Breath sounds: Normal breath sounds. No wheezing or rhonchi.  Abdominal:     General: There is no distension.     Palpations: Abdomen is soft.     Tenderness: There is no abdominal tenderness.  Musculoskeletal:     Right lower leg: No edema.     Left lower leg: No edema.  Lymphadenopathy:     Cervical: No cervical adenopathy.  Skin:    General: Skin is warm and dry.     Capillary Refill: Capillary refill takes less than 2 seconds.     Findings: No rash.  Neurological:     Mental Status: She is alert and oriented to person, place, and time. Mental status is at baseline.  Psychiatric:        Mood and Affect: Mood normal.        Behavior: Behavior normal.         Assessment & Plan   Problem List Items Addressed This Visit      Digestive   Chronic left-sided ulcerative colitis ([place])    Currently well controlled Followed by GI Patient states that the immune suppressive agent that they had been considering for treatment in the past is on hold right now She is getting her last hepatitis B immunization today      Relevant Orders   Hepatitis B vaccine adult IM (Completed)     Other   ADD (attention deficit disorder) - Primary    Currently stable and functioning well at work without Adderall We will hold off on any therapy currently Could consider other therapy in the future if needed       Other Visit Diagnoses    Need for vaccine for Td (tetanus-diphtheria)       Relevant Orders   Td : Tetanus/diphtheria >7yo Preservative  free (Completed)   Need for hepatitis B vaccination       Relevant Orders   Hepatitis B vaccine adult IM (Completed)       Return in about 4 months (around 10/20/2018) for CPE as scheduled.   The entirety of the information documented in the History of Present Illness, Review of Systems and Physical Exam were personally obtained by me. Portions of this information were initially documented by Tiburcio Pea, CMA and  reviewed by me for thoroughness and accuracy.    Virginia Crews, MD, MPH [facility] 06/21/2018 4:41 PM

## 2018-06-21 NOTE — Assessment & Plan Note (Signed)
Currently stable and functioning well at work without Adderall We will hold off on any therapy currently Could consider other therapy in the future if needed

## 2018-07-04 LAB — HM COLONOSCOPY

## 2018-07-16 ENCOUNTER — Telehealth: Payer: Self-pay | Admitting: Family Medicine

## 2018-07-16 NOTE — Telephone Encounter (Signed)
Pt called saying she needs a copy of her immunization record that we have on file faxed to her [facility], mainly her Hep B vaccine  Fax #  [phone]  Thanks Con Memos

## 2018-07-16 NOTE — Telephone Encounter (Signed)
Her Gastro doctor is [facility]

## 2018-07-18 NOTE — Telephone Encounter (Signed)
Vaccine record faxed to [facility]

## 2018-07-23 ENCOUNTER — Other Ambulatory Visit: Payer: Self-pay | Admitting: Hematology and Oncology

## 2018-07-23 DIAGNOSIS — K5150 Left sided colitis without complications: Secondary | ICD-10-CM

## 2018-07-26 ENCOUNTER — Encounter: Payer: Self-pay | Admitting: Pediatrics

## 2018-07-31 ENCOUNTER — Encounter: Payer: Self-pay | Admitting: Hematology and Oncology

## 2018-07-31 ENCOUNTER — Other Ambulatory Visit: Payer: Self-pay

## 2018-07-31 ENCOUNTER — Inpatient Hospital Stay: Payer: Managed Care, Other (non HMO) | Attending: Hematology and Oncology | Admitting: Hematology and Oncology

## 2018-07-31 ENCOUNTER — Inpatient Hospital Stay: Payer: Managed Care, Other (non HMO)

## 2018-07-31 VITALS — BP 114/80 | HR 86 | Temp 98.3°F | Resp 18 | Ht 64.0 in | Wt 195.1 lb

## 2018-07-31 VITALS — BP 118/76 | HR 71 | Temp 96.0°F | Resp 18

## 2018-07-31 DIAGNOSIS — K5150 Left sided colitis without complications: Secondary | ICD-10-CM | POA: Insufficient documentation

## 2018-07-31 MED ORDER — SODIUM CHLORIDE 0.9 % IV SOLN
500.0000 mg | Freq: Once | INTRAVENOUS | Status: AC
  Administered 2018-07-31: 500 mg via INTRAVENOUS
  Filled 2018-07-31: qty 50

## 2018-07-31 MED ORDER — ACETAMINOPHEN 325 MG PO TABS
650.0000 mg | ORAL_TABLET | Freq: Once | ORAL | Status: AC
  Administered 2018-07-31: 650 mg via ORAL
  Filled 2018-07-31: qty 2

## 2018-07-31 MED ORDER — DIPHENHYDRAMINE HCL 25 MG PO CAPS
25.0000 mg | ORAL_CAPSULE | Freq: Once | ORAL | Status: AC
  Administered 2018-07-31: 25 mg via ORAL
  Filled 2018-07-31: qty 1

## 2018-07-31 MED ORDER — SODIUM CHLORIDE 0.9 % IV SOLN
Freq: Once | INTRAVENOUS | Status: AC
  Administered 2018-07-31: 12:00:00 via INTRAVENOUS
  Filled 2018-07-31: qty 250

## 2018-07-31 NOTE — Progress Notes (Signed)
[place] [facility] day:  07/31/2018  Chief Complaint: Alison Kramer is a 63 y.o. female with ulcerative colitis who is referred by Dr. Teresa Pelton for initiation of Remicade (infliximab).   HPI:  The patient was diagnosed with diagnosed with ulcerative colitis in 1998 with extension to pancolitis 10/2017.  She has used Rowasa, Uceris foam and prednisone.  She had a flare in 05/2017 characterized by increased diarrhea with rectal bleeding and mild LLQ pain. Iritis flared at the same time. She feels this was set off by drinking a high fiber self-made fruit smoothie. She resumed low residue diet and symptoms improved in a 1 week.   She takes maintenance Lialda 4.8 g daily. Active colitis symptoms include diarrhea, increased frequency (3-4x/day), urgency and incomplete evacuation with frequent trips to bathroom. She denies rectal bleeding or nocturnal symptoms. She denies nausea, vomiting, heartburn, fevers or chills. She has occasional mild LLQ abdominal cramps without radiation.   She has generalized chronic arthralgias which have worsened over past few months. Arthralgias occur in neck, shoulders, hips, hands, knees and low back. She has tried Tylenol without relief. She suspects the arthralgias are related to her current active colitis symptoms. She denies any other extraintestinal manifestations (history of iritis).   Colonoscopy in 10/05/2017 revealed evidence of diffuse mild inflammation throughout the entire colon. Biopsies were consistent with moderate to severe chronic active colitis without dysplasia. There was no evidence of CMV. A rectal polyp was resected which showed patchy atypical epithelial changes indeterminate for dysplasia. Reactive changes were favored.   Colonoscopy on 06/07/2018 revealed scattered mild inflammation in recto-sigmoid, descending colon, ascending colon and 16m adenoma in sigmoid colon.   Bone density in 02/2018 revealed  osteopenia.  Testing in 10/08/2017:  TMPT activity was 25 (6.3-15.0).  Quantiferon gold was negative.  Hepatitis B surface antibody, hepatitis B surface antigen, and hepatitis B core antibody were negative.  Labs on 07/15/2018 revealed a hematocrit of 42.6, hemoglobin 13.9, MCV 95, platelets 363,000, WBC 7500, ANC 4100.  LFTs were normal.  Sedimentation rate was 6.  Symptomatically, she is fatigued. Patient denies any B symptoms. She denies any interval infections. She notes frequent stools. She has had 2 bowel movements since she arrived here in the clinic today. Patient denies melena or hematochezia. She has not appreciated any mucous in/on her stools.   Patient complains of polyarthralgias, which she feels are extra-abdominal manifestations of her ulcerative colitis. She has iritis that tends to flare in conjunction with her underlying GI disease.   Patient advises that she maintains an adequate appetite. She is eating well. Weight today is 195 lb 1.7 oz (88.5 kg). She is on Weight Watchers in active efforts to lose weight. Goal weight is around 160 pounds.   Patient denies any family history that is significant for any type of oncologic or hematologic disorders. Her ex-husband and son both have ulcerative colitis.   Patient denies pain in the clinic today.   Past Medical History:  Diagnosis Date  . ADHD (attention deficit hyperactivity disorder)   . Anxiety   . [place]   . Depression     Past Surgical History:  Procedure Laterality Date  . ABDOMINAL HYSTERECTOMY  1981  . BACK SURGERY    . BUNIONECTOMY      Family History  Problem Relation Age of Onset  . Breast cancer Mother   . Hyperlipidemia Mother   . Heart Problems Father   . CAD Father   .  Pancreatic disease Father   . Hypertension Brother   . Obesity Brother   . Heart Problems Brother   . Diabetes Brother   . Colon cancer Neg Hx     Social History:  reports that she has never smoked. She has  never used smokeless tobacco. She reports current alcohol use of about 1.0 standard drinks of alcohol per week. She reports that she does not use drugs.  She lives in [place].  She is retired from [employer].  The patient is alone today.  Allergies: No Known Allergies  Current Medications: Current Outpatient Medications  Medication Sig Dispense Refill  . albuterol (PROVENTIL HFA;VENTOLIN HFA) 108 (90 Base) MCG/ACT inhaler Inhale 2 puffs into the lungs every 6 (six) hours as needed for wheezing or shortness of breath. 1 Inhaler 2  . cetirizine (ZYRTEC) 10 MG tablet Take 1 tablet (10 mg total) by mouth daily. 90 tablet 1  . cholecalciferol (VITAMIN D) 1000 UNITS tablet Take 2,000 Units by mouth daily.     . diphenhydrAMINE (BENADRYL) 25 MG tablet Take 25 mg by mouth at bedtime as needed.    . inFLIXimab in sodium chloride 0.9 % Inject 500 mg into the vein.    . mesalamine (LIALDA) 1.2 g EC tablet Take by mouth daily with breakfast.    . Omega-3 Fatty Acids (FISH OIL PO) Take 1,000 mg by mouth daily.     Marland Kitchen omeprazole (PRILOSEC OTC) 20 MG tablet Take 20 mg by mouth daily.    . Probiotic Product (PROBIOTIC ADVANCED PO) Take 1 tablet by mouth daily.     . traZODone (DESYREL) 100 MG tablet TAKE 1 TABLET BY MOUTH AT  BEDTIME 90 tablet 3   No current facility-administered medications for this visit.     Review of Systems:  GENERAL:  Fatigue.  No fevers, sweats or weight loss.  Patient on Weight Watchers (weight goal 160 pounds). PERFORMANCE STATUS (ECOG):  1 HEENT:  Iritis flares with GI symptoms.  Vision fuzzy (right > left).  No runny nose, sore throat, mouth sores or tenderness. Lungs: No shortness of breath.  Occasional cough.  No hemoptysis. Cardiac:  No chest pain, palpitations, orthopnea, or PND. GI:  Loose stools.  Some abdominal cramping.  No nausea, vomiting, constipation, melena or hematochezia. GU:  No urgency, frequency, dysuria, or hematuria. Musculoskeletal:  No back pain.  Joint  pains (hip, knees, shoulder, hands, neck and lower back).  No muscle tenderness. Extremities:  No pain or swelling. Skin:  Rosacea.  No rashes or skin changes. Neuro:  No headache, numbness or weakness, balance or coordination issues. Endocrine:  No diabetes, thyroid issues, hot flashes or night sweats. Psych:  No mood changes, depression or anxiety. Pain:  No focal pain. Review of systems:  All other systems reviewed and found to be negative.  Physical Exam: Blood pressure 114/80, pulse 86, temperature 98.3 F (36.8 C), temperature source Oral, resp. rate 18, height 5' 4"  (1.626 m), weight 195 lb 1.7 oz (88.5 kg), SpO2 97 %. GENERAL:  Well developed, well nourished, woman sitting comfortably in the exam room in no acute distress. MENTAL STATUS:  Alert and oriented to person, place and time. HEAD:  Shoulder length gray hair.  Normocephalic, atraumatic, face symmetric, no Cushingoid features. EYES:  Blue eyes.  Pupils equal round and reactive to light and accomodation.  No conjunctivitis or scleral icterus. ENT:  Oropharynx clear without lesion.  Tongue normal. Mucous membranes moist.  RESPIRATORY:  Clear to auscultation without rales, wheezes  or rhonchi. CARDIOVASCULAR:  Regular rate and rhythm without murmur, rub or gallop. ABDOMEN:  Soft, non-tender, with active bowel sounds, and no hepatosplenomegaly.  No masses. SKIN:  No rashes, ulcers or lesions. EXTREMITIES: No edema, no skin discoloration or tenderness.  No palpable cords. LYMPH NODES: No palpable cervical, supraclavicular, axillary or inguinal adenopathy  NEUROLOGICAL: Unremarkable. PSYCH:  Appropriate.   No visits with results within 3 Day(s) from this visit.  Latest known visit with results is:  Abstract on 07/15/2018  Component Date Value Ref Range Status  . HM Colonoscopy 06/07/2018 See Report (in chart)  See Report (in chart), Patient Reported Final   chronic active colitis throughout colon     Assessment:  Alison Kramer is a 63 y.o. female with ulcerative colitis. She has used mesalamine (Rowasa), budesonide (Uceris) and prednisone.  Colonoscopy in 10/05/2017 revealed evidence of diffuse mild inflammation throughout the entire colon. Biopsies were consistent with moderate to severe chronic active colitis without dysplasia. There was no evidence of CMV. A rectal polyp was resected which showed patchy atypical epithelial changes indeterminate for dysplasia. Reactive changes were favored.  Colonoscopy on 06/07/2018 revealed scattered mild inflammation in recto-sigmoid, descending colon, ascending colon and 45m adenoma in sigmoid colon.   Testing in 10/08/2017:  TMPT activity was 25 (6.3-15.0).  Quantiferon gold was negative.  Hepatitis B surface antibody, hepatitis B surface antigen, and hepatitis B core antibody were negative.  Symptomatically, she is fatigued.  She has frequent stools and arthralgias.  Plan: 1.   Labs today:  CBC with diff, LFTs, sed rate. 2.   Ulcerative colitis  Discuss plan for Remicade   Remicade 5 mg/kg over 2 hours (rounded to nearest 100 mg) on week 0, 2, and 6 for induction.   Remicade maintenance every 8 weeks.   Premeds:  acetaminophen 650 mg po and citirizine 10 mg po   CBC with diff, LFTs, ESR prior to every other infusion.  Discuss potential side effects:  infusion reactions, autoimmune disorders, hematologic disorders, hepatis reactions, reactivation of hepatitis B, infections, malignancies.  3.   RTC in 2 weeks and 6 weeks for labs (CBC with diff, LFTs, ESR) and Remicade.   BHonor Loh NP  07/31/2018, 11:11 AM   I saw and evaluated the patient, participating in the key portions of the service and reviewing pertinent diagnostic studies and records.  I reviewed the nurse practitioner's note and agree with the findings and the plan.  The assessment and plan were discussed with the patient.  Several questions were asked by the patient and answered.   MNolon Stalls  MD 07/31/2018,11:11 AM

## 2018-07-31 NOTE — Progress Notes (Signed)
Pt here for initial visit. Referred by Teresa Pelton d/t Ulcerative Colitis. Denies any concerns at thsis time.

## 2018-07-31 NOTE — Patient Instructions (Signed)
Infliximab injection What is this medicine? INFLIXIMAB (in [place] i mab) is used to treat Crohn's disease and ulcerative colitis. It is also used to treat ankylosing spondylitis, plaque psoriasis, and some forms of arthritis. This medicine may be used for other purposes; ask your health care provider or pharmacist if you have questions. COMMON BRAND NAME(S): INFLECTRA, Remicade, RENFLEXIS What should I tell my health care provider before I take this medicine? They need to know if you have any of these conditions: -cancer -current or past resident of [place] or [place] -diabetes -exposure to tuberculosis -Guillain-Barre syndrome -heart failure -hepatitis or liver disease -immune system problems -infection -lung or breathing disease, like COPD -multiple sclerosis -receiving phototherapy for the skin -seizure disorder -an unusual or allergic reaction to infliximab, mouse proteins, other medicines, foods, dyes, or preservatives -pregnant or trying to get pregnant -breast-feeding How should I use this medicine? This medicine is for injection into a vein. It is usually given by a health care professional in a hospital or clinic setting. A special MedGuide will be given to you by the pharmacist with each prescription and refill. Be sure to read this information carefully each time. Talk to your pediatrician regarding the use of this medicine in children. While this drug may be prescribed for children as young as 24 years of age for selected conditions, precautions do apply. Overdosage: If you think you have taken too much of this medicine contact a poison control center or emergency room at once. NOTE: This medicine is only for you. Do not share this medicine with others. What if I miss a dose? It is important not to miss your dose. Call your doctor or health care professional if you are unable to keep an appointment. What may interact with this medicine? Do not take this  medicine with any of the following medications: -biologic medicines such as abatacept, adalimumab, anakinra, certolizumab, etanercept, golimumab, rituximab, secukinumab, tocilizumab, tofactinib, ustekinumab -live vaccines This list may not describe all possible interactions. Give your health care provider a list of all the medicines, herbs, non-prescription drugs, or dietary supplements you use. Also tell them if you smoke, drink alcohol, or use illegal drugs. Some items may interact with your medicine. What should I watch for while using this medicine? Your condition will be monitored carefully while you are receiving this medicine. Visit your doctor or health care professional for regular checks on your progress. You may need blood work done while you are taking this medicine. Before beginning therapy, your doctor may do a test to see if you have been exposed to tuberculosis. Call your doctor or health care professional for advice if you get a fever, chills or sore throat, or other symptoms of a cold or flu. Do not treat yourself. This drug decreases your body's ability to fight infections. Try to avoid being around people who are sick. This medicine may make the symptoms of heart failure worse in some patients. If you notice symptoms such as increased shortness of breath or swelling of the ankles or legs, contact your health care provider right away. If you are going to have surgery or dental work, tell your health care professional or dentist that you have received this medicine. If you take this medicine for plaque psoriasis, stay out of the sun. If you cannot avoid being in the sun, wear protective clothing and use sunscreen. Do not use sun lamps or tanning beds/booths. Talk to your doctor about your risk of cancer. You may be  more at risk for certain types of cancers if you take this medicine. What side effects may I notice from receiving this medicine? Side effects that you should report to your  doctor or health care professional as soon as possible: -allergic reactions like skin rash, itching or hives, swelling of the face, lips, or tongue -breathing problems -changes in vision -chest pain -fever or chills, usually related to the infusion -joint pain -pain, tingling, numbness in the hands or feet -redness, blistering, peeling or loosening of the skin, including inside the mouth -seizures -signs of infection - fever or chills, cough, sore throat, flu-like symptoms, pain or difficulty passing urine -signs and symptoms of liver injury like dark yellow or brown urine; general ill feeling; light-colored stools; loss of appetite; nausea; right upper belly pain; unusually weak or tired; yellowing of the eyes or skin -signs and symptoms of a stroke like changes in vision; confusion; trouble speaking or understanding; severe headaches; sudden numbness or weakness of the face, arm or leg; trouble walking; dizziness; loss of balance or coordination -swelling of the ankles, feet, or hands -swollen lymph nodes in the neck, underarm, or groin areas -unusual bleeding or bruising -unusually weak or tired Side effects that usually do not require medical attention (report to your doctor or health care professional if they continue or are bothersome): -headache -nausea -stomach pain -upset stomach This list may not describe all possible side effects. Call your doctor for medical advice about side effects. You may report side effects to FDA at 1-800-FDA-1088. Where should I keep my medicine? This drug is given in a hospital or clinic and will not be stored at home. NOTE: This sheet is a summary. It may not cover all possible information. If you have questions about this medicine, talk to your doctor, pharmacist, or health care provider.  2019 Elsevier/Gold Standard (2016-06-21 13:45:32)

## 2018-08-14 ENCOUNTER — Other Ambulatory Visit: Payer: Managed Care, Other (non HMO)

## 2018-08-14 ENCOUNTER — Ambulatory Visit: Payer: Managed Care, Other (non HMO)

## 2018-08-15 ENCOUNTER — Other Ambulatory Visit: Payer: Managed Care, Other (non HMO)

## 2018-08-15 ENCOUNTER — Ambulatory Visit: Payer: Managed Care, Other (non HMO)

## 2018-08-28 ENCOUNTER — Ambulatory Visit: Payer: Managed Care, Other (non HMO)

## 2018-08-28 ENCOUNTER — Other Ambulatory Visit: Payer: Managed Care, Other (non HMO)

## 2018-08-29 ENCOUNTER — Ambulatory Visit: Payer: Managed Care, Other (non HMO)

## 2018-08-29 ENCOUNTER — Other Ambulatory Visit: Payer: Managed Care, Other (non HMO)

## 2018-10-14 ENCOUNTER — Encounter: Payer: Self-pay | Admitting: Family Medicine

## 2019-01-21 ENCOUNTER — Telehealth: Payer: Self-pay | Admitting: Family Medicine

## 2019-01-21 NOTE — Telephone Encounter (Signed)
Patient was advised.  

## 2019-01-21 NOTE — Telephone Encounter (Signed)
Best thing for hives is antihistamine (Zyrtec or Claritin) and pepcid twice daily

## 2019-01-21 NOTE — Telephone Encounter (Signed)
Pt woke up Monday with itchy hives from her shoulders to her thighs.  Pt asking if there is something she can try over the counter to get rid of them.  Please call at [phone].  Thanks, [employer]

## 2019-03-10 ENCOUNTER — Telehealth: Payer: Self-pay | Admitting: Family Medicine

## 2019-03-10 NOTE — Telephone Encounter (Signed)
error 

## 2019-03-11 NOTE — Progress Notes (Signed)
Patient: Alison Kramer Female    DOB: 02-Sep-1955   63 y.o.   MRN: [id number] Visit Date: 03/12/2019  Today's Provider: Lavon Paganini, MD   Chief Complaint  Patient presents with  . Painful Intercourse   Subjective:    HPI  Painful Intercourse Patient states that she is having painful intercourse. Patient states that it happened years ago. Patient states that she was placed on a cream ( Premarin) and it helped out a lot.  Complains of vaginal dryness as well  No Known Allergies   Current Outpatient Medications:  .  albuterol (PROVENTIL HFA;VENTOLIN HFA) 108 (90 Base) MCG/ACT inhaler, Inhale 2 puffs into the lungs every 6 (six) hours as needed for wheezing or shortness of breath., Disp: 1 Inhaler, Rfl: 2 .  cetirizine (ZYRTEC) 10 MG tablet, Take 1 tablet (10 mg total) by mouth daily., Disp: 90 tablet, Rfl: 1 .  cholecalciferol (VITAMIN D) 1000 UNITS tablet, Take 2,000 Units by mouth daily. , Disp: , Rfl:  .  diphenhydrAMINE (BENADRYL) 25 MG tablet, Take 25 mg by mouth at bedtime as needed., Disp: , Rfl:  .  inFLIXimab in sodium chloride 0.9 %, Inject 500 mg into the vein., Disp: , Rfl:  .  mesalamine (LIALDA) 1.2 g EC tablet, Take by mouth daily with breakfast., Disp: , Rfl:  .  Omega-3 Fatty Acids (FISH OIL PO), Take 1,000 mg by mouth daily. , Disp: , Rfl:  .  omeprazole (PRILOSEC OTC) 20 MG tablet, Take 20 mg by mouth daily., Disp: , Rfl:  .  Probiotic Product (PROBIOTIC ADVANCED PO), Take 1 tablet by mouth daily. , Disp: , Rfl:  .  traZODone (DESYREL) 100 MG tablet, TAKE 1 TABLET BY MOUTH AT  BEDTIME, Disp: 90 tablet, Rfl: 3  Review of Systems  Constitutional: Negative.   HENT: Negative.   Respiratory: Negative.   Cardiovascular: Negative.   Genitourinary: Negative for decreased urine volume, frequency, hematuria, urgency, vaginal bleeding and vaginal discharge.  Musculoskeletal: Negative.   Psychiatric/Behavioral: Negative.     Social History   Tobacco Use   . Smoking status: Never Smoker  . Smokeless tobacco: Never Used  Substance Use Topics  . Alcohol use: Yes    Alcohol/week: 1.0 standard drinks    Types: 1 Glasses of wine per week    Comment: "Most Nights"      Objective:   BP 118/72 (BP Location: Left Arm, Patient Position: Sitting, Cuff Size: Normal)   Pulse 81   Temp (!) 96.8 F (36 C) (Temporal)   Wt 172 lb (78 kg)   SpO2 98%   BMI 29.52 kg/m  Vitals:   03/12/19 0950  BP: 118/72  Pulse: 81  Temp: (!) 96.8 F (36 C)  TempSrc: Temporal  SpO2: 98%  Weight: 172 lb (78 kg)  Body mass index is 29.52 kg/m.   Physical Exam Constitutional:      General: She is not in acute distress.    Appearance: Normal appearance. She is not diaphoretic.  HENT:     Head: Normocephalic and atraumatic.  Cardiovascular:     Rate and Rhythm: Normal rate and regular rhythm.     Pulses: Normal pulses.     Heart sounds: Normal heart sounds. No murmur.  Pulmonary:     Effort: Pulmonary effort is normal. No respiratory distress.     Breath sounds: Normal breath sounds. No wheezing.  Genitourinary:    Comments: Deferred as she reports stable symptoms from previous  diagnosis Neurological:     Mental Status: She is alert and oriented to person, place, and time. Mental status is at baseline.  Psychiatric:        Mood and Affect: Mood normal.        Behavior: Behavior normal.      No results found for any visits on 03/12/19.     Assessment & Plan   1. Atrophic vaginitis - patient's history consistent with atrophic vaginitis that was previously helped with topical estrogen - will d/c Premarin as she is self pay and start estrace cream instead - sample of premarin given today - encouraged use of lubricant as well  Other orders - estradiol (ESTRACE VAGINAL) 0.1 MG/GM vaginal cream; Place 1 Applicatorful vaginally 3 (three) times a week.  Dispense: 42.5 g; Refill: 12   Return if symptoms worsen or fail to improve.   The entirety  of the information documented in the History of Present Illness, Review of Systems and Physical Exam were personally obtained by me. Portions of this information were initially documented by [facility], CMA and reviewed by me for thoroughness and accuracy.    Bacigalupo, Dionne Bucy, MD MPH [facility]

## 2019-03-12 ENCOUNTER — Ambulatory Visit (INDEPENDENT_AMBULATORY_CARE_PROVIDER_SITE_OTHER): Payer: Self-pay | Admitting: Family Medicine

## 2019-03-12 ENCOUNTER — Other Ambulatory Visit: Payer: Self-pay

## 2019-03-12 ENCOUNTER — Encounter: Payer: Self-pay | Admitting: Family Medicine

## 2019-03-12 VITALS — BP 118/72 | HR 81 | Temp 96.8°F | Wt 172.0 lb

## 2019-03-12 DIAGNOSIS — N952 Postmenopausal atrophic vaginitis: Secondary | ICD-10-CM

## 2019-03-12 MED ORDER — ESTRADIOL 0.1 MG/GM VA CREA: 1.0000 | TOPICAL_CREAM | VAGINAL | 12 refills | Status: DC

## 2019-03-12 NOTE — Patient Instructions (Signed)
Atrophic Vaginitis  Atrophic vaginitis is a condition in which the tissues that line the vagina become dry and thin. This condition is most common in women who have stopped having regular menstrual periods (are in menopause). This usually starts when a woman is 45-64 years old. That is the time when a woman's estrogen levels begin to drop (decrease). Estrogen is a female hormone. It helps to keep the tissues of the vagina moist. It stimulates the vagina to produce a clear fluid that lubricates the vagina for sexual intercourse. This fluid also protects the vagina from infection. Lack of estrogen can cause the lining of the vagina to get thinner and dryer. The vagina may also shrink in size. It may become less elastic. Atrophic vaginitis tends to get worse over time as a woman's estrogen level drops. What are the causes? This condition is caused by the normal drop in estrogen that happens around the time of menopause. What increases the risk? Certain conditions or situations may lower a woman's estrogen level, leading to a higher risk for atrophic vaginitis. You are more likely to develop this condition if:  You are taking medicines that block estrogen.  You have had your ovaries removed.  You are being treated for cancer with X-ray (radiation) or medicines (chemotherapy).  You have given birth or are breastfeeding.  You are older than age 50.  You smoke. What are the signs or symptoms? Symptoms of this condition include:  Pain, soreness, or bleeding during sexual intercourse (dyspareunia).  Vaginal burning, irritation, or itching.  Pain or bleeding when a speculum is used in a vaginal exam (pelvic exam).  Having burning pain when passing urine.  Vaginal discharge that is brown or yellow. In some cases, there are no symptoms. How is this diagnosed? This condition is diagnosed by taking a medical history and doing a physical exam. This will include a pelvic exam that checks the  vaginal tissues. Though rare, you may also have other tests, including:  A urine test.  A test that checks the acid balance in your vagina (acid balance test). How is this treated? Treatment for this condition depends on how severe your symptoms are. Treatment may include:  Using an over-the-counter vaginal lubricant before sex.  Using a long-acting vaginal moisturizer.  Using low-dose vaginal estrogen for moderate to severe symptoms that do not respond to other treatments. Options include creams, tablets, and inserts (vaginal rings). Before you use a vaginal estrogen, tell your health care provider if you have a history of: ? Breast cancer. ? Endometrial cancer. ? Blood clots. If you are not sexually active and your symptoms are very mild, you may not need treatment. Follow these instructions at home: Medicines  Take over-the-counter and prescription medicines only as told by your health care provider. Do not use herbal or alternative medicines unless your health care provider says that you can.  Use over-the-counter creams, lubricants, or moisturizers for dryness only as directed by your health care provider. General instructions  If your atrophic vaginitis is caused by menopause, discuss all of your menopause symptoms and treatment options with your health care provider.  Do not douche.  Do not use products that can make your vagina dry. These include: ? Scented feminine sprays. ? Scented tampons. ? Scented soaps.  Vaginal intercourse can help to improve blood flow and elasticity of vaginal tissue. If it hurts to have sex, try using a lubricant or moisturizer just before having intercourse. Contact a health care provider if:    Your discharge looks different than normal.  Your vagina has an unusual smell.  You have new symptoms.  Your symptoms do not improve with treatment.  Your symptoms get worse. Summary  Atrophic vaginitis is a condition in which the tissues that  line the vagina become dry and thin. It is most common in women who have stopped having regular menstrual periods (are in menopause).  Treatment options include using vaginal lubricants and low-dose vaginal estrogen.  Contact a health care provider if your vagina has an unusual smell, or if your symptoms get worse or do not improve after treatment. This information is not intended to replace advice given to you by your health care provider. Make sure you discuss any questions you have with your health care provider. Document Released: 10/06/2014 Document Revised: 05/04/2017 Document Reviewed: 02/15/2017 Elsevier Patient Education  2020 Elsevier Inc.  

## 2019-11-06 ENCOUNTER — Ambulatory Visit (INDEPENDENT_AMBULATORY_CARE_PROVIDER_SITE_OTHER): Payer: Medicare HMO | Admitting: Family Medicine

## 2019-11-06 ENCOUNTER — Other Ambulatory Visit: Payer: Self-pay

## 2019-11-06 ENCOUNTER — Encounter: Payer: Self-pay | Admitting: Family Medicine

## 2019-11-06 VITALS — BP 102/51 | HR 59 | Temp 97.8°F | Resp 16 | Ht 64.0 in | Wt 172.0 lb

## 2019-11-06 DIAGNOSIS — D72829 Elevated white blood cell count, unspecified: Secondary | ICD-10-CM | POA: Diagnosis not present

## 2019-11-06 DIAGNOSIS — F988 Other specified behavioral and emotional disorders with onset usually occurring in childhood and adolescence: Secondary | ICD-10-CM | POA: Diagnosis not present

## 2019-11-06 DIAGNOSIS — E663 Overweight: Secondary | ICD-10-CM | POA: Diagnosis not present

## 2019-11-06 DIAGNOSIS — K5150 Left sided colitis without complications: Secondary | ICD-10-CM | POA: Diagnosis not present

## 2019-11-06 DIAGNOSIS — Z789 Other specified health status: Secondary | ICD-10-CM

## 2019-11-06 DIAGNOSIS — J301 Allergic rhinitis due to pollen: Secondary | ICD-10-CM

## 2019-11-06 NOTE — Assessment & Plan Note (Signed)
Well controlled No oral medications needed at this time Patient now retired

## 2019-11-06 NOTE — Assessment & Plan Note (Addendum)
Well controlled with no symptoms Followed by GI at [facility] Patient did get one treatment of Remicade infusion, reports she got a migraine headache Patient is due to colonoscopy Will see GI this month

## 2019-11-06 NOTE — Progress Notes (Signed)
Established patient visit   Patient: Alison Kramer   DOB: 1955/07/13   64 y.o. Female  MRN: [id number] Visit Date: 11/06/2019  I,Sulibeya S Dimas,acting as a scribe for Lavon Paganini, MD.,have documented all relevant documentation on the behalf of Lavon Paganini, MD,as directed by  Lavon Paganini, MD while in the presence of Lavon Paganini, MD.  Today's healthcare provider: Lavon Paganini, MD   Chief Complaint  Patient presents with  . ADHD   Subjective    HPI Follow up for ADHD  The patient was last seen for this 18 months ago. Changes made at last visit include no changes, will continue to hold off on any therapy.  She reports good symptom control without medication. Patient reports she stopped working on February 2020. She feels that condition is Unchanged. She is not having side effects.   -----------------------------------------------------------------------------------------    Patient Active Problem List   Diagnosis Date Noted  . Overweight 11/07/2019  . Insomnia 10/12/2015  . Allergic rhinitis 09/29/2015  . ADD (attention deficit disorder) 12/10/2014  . Ulcerative colitis, left sided, chronic ([place]) 11/22/2011  . Acute or subacute iridocyclitis 03/01/2011  . Colitis gravis ([place]) 03/01/2011   Social History   Tobacco Use  . Smoking status: Never Smoker  . Smokeless tobacco: Never Used  Substance Use Topics  . Alcohol use: Yes    Alcohol/week: 1.0 standard drinks    Types: 1 Glasses of wine per week    Comment: "Most Nights"  . Drug use: No       Medications: Outpatient Medications Prior to Visit  Medication Sig  . cetirizine (ZYRTEC) 10 MG tablet Take 1 tablet (10 mg total) by mouth daily.  . cholecalciferol (VITAMIN D) 1000 UNITS tablet Take 2,000 Units by mouth daily.   . diphenhydrAMINE (BENADRYL) 25 MG tablet Take 25 mg by mouth at bedtime as needed.  Marland Kitchen estradiol (ESTRACE VAGINAL) 0.1 MG/GM vaginal cream Place 1  Applicatorful vaginally 3 (three) times a week.  . Omega-3 Fatty Acids (FISH OIL PO) Take 1,000 mg by mouth daily.   Marland Kitchen omeprazole (PRILOSEC OTC) 20 MG tablet Take 20 mg by mouth daily.  . Probiotic Product (PROBIOTIC ADVANCED PO) Take 1 tablet by mouth daily.   . [DISCONTINUED] albuterol (PROVENTIL HFA;VENTOLIN HFA) 108 (90 Base) MCG/ACT inhaler Inhale 2 puffs into the lungs every 6 (six) hours as needed for wheezing or shortness of breath.  . [DISCONTINUED] inFLIXimab in sodium chloride 0.9 % Inject 500 mg into the vein.  . [DISCONTINUED] mesalamine (LIALDA) 1.2 g EC tablet Take by mouth daily with breakfast.  . [DISCONTINUED] traZODone (DESYREL) 100 MG tablet TAKE 1 TABLET BY MOUTH AT  BEDTIME (Patient taking differently: Take 100 mg by mouth. )   No facility-administered medications prior to visit.    Review of Systems  Constitutional: Negative.   Respiratory: Negative.   Cardiovascular: Negative.   Neurological: Negative.   Psychiatric/Behavioral: Negative.     Last metabolic panel Lab Results  Component Value Date   GLUCOSE 78 10/15/2017   NA 145 (H) 10/15/2017   K 4.3 10/15/2017   CL 110 (H) 10/15/2017   CO2 23 10/15/2017   BUN 22 10/15/2017   CREATININE 0.97 10/15/2017   GFRNONAA 63 10/15/2017   GFRAA 72 10/15/2017   CALCIUM 8.8 10/15/2017   PROT 6.2 10/15/2017   ALBUMIN 3.9 10/15/2017   LABGLOB 2.3 10/15/2017   AGRATIO 1.7 10/15/2017   BILITOT 0.2 10/15/2017   ALKPHOS 39 10/15/2017  AST 16 10/15/2017   ALT 17 10/15/2017   Last lipids Lab Results  Component Value Date   CHOL 181 10/15/2017   HDL 64 10/15/2017   LDLCALC 97 10/15/2017   TRIG 102 10/15/2017   CHOLHDL 2.8 10/15/2017   Last thyroid functions Lab Results  Component Value Date   TSH 2.440 08/09/2015      Objective    BP (!) 102/51 (BP Location: Left Arm, Patient Position: Sitting, Cuff Size: Normal)   Pulse (!) 59   Temp 97.8 F (36.6 C) (Temporal)   Resp 16   Ht 5' 4"  (1.626 m)   Wt  172 lb (78 kg)   BMI 29.52 kg/m  BP Readings from Last 3 Encounters:  11/06/19 (!) 102/51  03/12/19 118/72  07/31/18 118/76   Wt Readings from Last 3 Encounters:  11/06/19 172 lb (78 kg)  03/12/19 172 lb (78 kg)  07/31/18 195 lb 1.7 oz (88.5 kg)      Physical Exam Vitals reviewed.  Constitutional:      General: She is not in acute distress.    Appearance: Normal appearance. She is well-developed. She is not diaphoretic.  HENT:     Head: Normocephalic and atraumatic.  Eyes:     General: No scleral icterus.    Conjunctiva/sclera: Conjunctivae normal.  Neck:     Thyroid: No thyromegaly.  Cardiovascular:     Rate and Rhythm: Normal rate and regular rhythm.     Pulses: Normal pulses.     Heart sounds: Normal heart sounds. No murmur.  Pulmonary:     Effort: Pulmonary effort is normal. No respiratory distress.     Breath sounds: Normal breath sounds. No wheezing, rhonchi or rales.  Musculoskeletal:     Cervical back: Neck supple.     Right lower leg: No edema.     Left lower leg: No edema.  Lymphadenopathy:     Cervical: No cervical adenopathy.  Skin:    General: Skin is warm and dry.     Findings: No rash.  Neurological:     Mental Status: She is alert and oriented to person, place, and time. Mental status is at baseline.  Psychiatric:        Mood and Affect: Mood normal.        Behavior: Behavior normal.       Results for orders placed or performed in visit on 11/06/19  HM COLONOSCOPY  Result Value Ref Range   HM Colonoscopy See Report (in chart) See Report (in chart), Patient Reported    Assessment & Plan     Problem List Items Addressed This Visit      Respiratory   Allergic rhinitis    Chronic and well controlled Continue OTC antihistamine and flonase prn        Digestive   Ulcerative colitis, left sided, chronic (HCC)    Well controlled with no symptoms Followed by GI at [facility] Patient did get one treatment of Remicade infusion, reports she got a  migraine headache Patient is due to colonoscopy Will see GI this month      Relevant Orders   CBC with Differential/Platelet   Comprehensive metabolic panel   Lipid Panel With LDL/HDL Ratio     Other   ADD (attention deficit disorder) - Primary    Well controlled No oral medications needed at this time Patient now retired       Relevant Orders   CBC with Differential/Platelet   Comprehensive metabolic panel  Lipid Panel With LDL/HDL Ratio   Overweight    Discussed importance of healthy weight management Discussed diet and exercise       Relevant Orders   CBC with Differential/Platelet   Comprehensive metabolic panel   Lipid Panel With LDL/HDL Ratio    Other Visit Diagnoses    Unknown status of immunity to COVID-19 virus       Relevant Orders   SAR CoV2 Serology (COVID 19)AB(IGG)IA   Leukocytosis, unspecified type       Relevant Orders   CBC with Differential/Platelet       Return in about 4 weeks (around 12/04/2019) for AWV, Welcome to medicare.      I, Lavon Paganini, MD, have reviewed all documentation for this visit. The documentation on 11/07/19 for the exam, diagnosis, procedures, and orders are all accurate and complete.   Miniya Miguez, Dionne Bucy, MD, MPH [facility]

## 2019-11-07 DIAGNOSIS — E663 Overweight: Secondary | ICD-10-CM | POA: Insufficient documentation

## 2019-11-07 NOTE — Assessment & Plan Note (Signed)
Chronic and well controlled Continue OTC antihistamine and flonase prn

## 2019-11-07 NOTE — Assessment & Plan Note (Signed)
Discussed importance of healthy weight management Discussed diet and exercise  

## 2019-11-11 DIAGNOSIS — Z7952 Long term (current) use of systemic steroids: Secondary | ICD-10-CM | POA: Diagnosis not present

## 2019-11-11 DIAGNOSIS — E663 Overweight: Secondary | ICD-10-CM | POA: Diagnosis not present

## 2019-11-11 DIAGNOSIS — K5150 Left sided colitis without complications: Secondary | ICD-10-CM | POA: Diagnosis not present

## 2019-11-11 DIAGNOSIS — Z789 Other specified health status: Secondary | ICD-10-CM | POA: Diagnosis not present

## 2019-11-11 DIAGNOSIS — F988 Other specified behavioral and emotional disorders with onset usually occurring in childhood and adolescence: Secondary | ICD-10-CM | POA: Diagnosis not present

## 2019-11-11 DIAGNOSIS — E7800 Pure hypercholesterolemia, unspecified: Secondary | ICD-10-CM | POA: Diagnosis not present

## 2019-11-12 ENCOUNTER — Telehealth: Payer: Self-pay

## 2019-11-12 LAB — COMPREHENSIVE METABOLIC PANEL
ALT: 11 IU/L (ref 0–32)
AST: 19 IU/L (ref 0–40)
Albumin/Globulin Ratio: 2.1 (ref 1.2–2.2)
Albumin: 4.5 g/dL (ref 3.8–4.8)
Alkaline Phosphatase: 54 IU/L (ref 48–121)
BUN/Creatinine Ratio: 28 (ref 12–28)
BUN: 27 mg/dL (ref 8–27)
Bilirubin Total: 0.4 mg/dL (ref 0.0–1.2)
CO2: 22 mmol/L (ref 20–29)
Calcium: 9.4 mg/dL (ref 8.7–10.3)
Chloride: 107 mmol/L — ABNORMAL HIGH (ref 96–106)
Creatinine, Ser: 0.98 mg/dL (ref 0.57–1.00)
GFR calc Af Amer: 71 mL/min/{1.73_m2} (ref >59)
GFR calc non Af Amer: 61 mL/min/{1.73_m2} (ref >59)
Globulin, Total: 2.1 g/dL (ref 1.5–4.5)
Glucose: 80 mg/dL (ref 65–99)
Potassium: 4.3 mmol/L (ref 3.5–5.2)
Sodium: 142 mmol/L (ref 134–144)
Total Protein: 6.6 g/dL (ref 6.0–8.5)

## 2019-11-12 LAB — CBC WITH DIFFERENTIAL/PLATELET
Basophils Absolute: 0.1 10*3/uL (ref 0.0–0.2)
Basos: 1 %
EOS (ABSOLUTE): 0.4 10*3/uL (ref 0.0–0.4)
Eos: 5 %
Hematocrit: 41.4 % (ref 34.0–46.6)
Hemoglobin: 13.7 g/dL (ref 11.1–15.9)
Immature Grans (Abs): 0 10*3/uL (ref 0.0–0.1)
Immature Granulocytes: 0 %
Lymphocytes Absolute: 2.3 10*3/uL (ref 0.7–3.1)
Lymphs: 33 %
MCH: 31.1 pg (ref 26.6–33.0)
MCHC: 33.1 g/dL (ref 31.5–35.7)
MCV: 94 fL (ref 79–97)
Monocytes Absolute: 0.5 10*3/uL (ref 0.1–0.9)
Monocytes: 7 %
Neutrophils Absolute: 3.7 10*3/uL (ref 1.4–7.0)
Neutrophils: 54 %
Platelets: 313 10*3/uL (ref 150–450)
RBC: 4.4 x10E6/uL (ref 3.77–5.28)
RDW: 12.9 % (ref 11.7–15.4)
WBC: 6.9 10*3/uL (ref 3.4–10.8)

## 2019-11-12 LAB — SAR COV2 SEROLOGY (COVID19)AB(IGG),IA: DiaSorin SARS-CoV-2 Ab, IgG: POSITIVE

## 2019-11-12 LAB — LIPID PANEL WITH LDL/HDL RATIO
Cholesterol, Total: 231 mg/dL — ABNORMAL HIGH (ref 100–199)
HDL: 50 mg/dL (ref >39)
LDL Chol Calc (NIH): 165 mg/dL — ABNORMAL HIGH (ref 0–99)
LDL/HDL Ratio: 3.3 ratio — ABNORMAL HIGH (ref 0.0–3.2)
Triglycerides: 91 mg/dL (ref 0–149)
VLDL Cholesterol Cal: 16 mg/dL (ref 5–40)

## 2019-11-12 NOTE — Telephone Encounter (Signed)
-----   Message from Virginia Crews, MD sent at 11/12/2019  9:36 AM EDT ----- Normal labs, except high cholesterol.  The 10-year ASCVD (heart disease and stroke) risk score Mikey Bussing DC Jr., et al., 2013) is: 3.7%. This is low, so no need for medications at this time.  I do recommend diet low in saturated fat and regular exercise - 30 min at least 5 times per week

## 2019-11-12 NOTE — Telephone Encounter (Signed)
Patient advised as below. Patient verbalizes understanding and is in agreement with treatment plan.  

## 2019-12-04 ENCOUNTER — Encounter: Payer: Self-pay | Admitting: Family Medicine

## 2019-12-29 DIAGNOSIS — K5100 Ulcerative (chronic) pancolitis without complications: Secondary | ICD-10-CM | POA: Diagnosis not present

## 2019-12-29 DIAGNOSIS — K5150 Left sided colitis without complications: Secondary | ICD-10-CM | POA: Diagnosis not present

## 2020-03-19 DIAGNOSIS — H532 Diplopia: Secondary | ICD-10-CM | POA: Diagnosis not present

## 2020-03-21 DIAGNOSIS — Z0100 Encounter for examination of eyes and vision without abnormal findings: Secondary | ICD-10-CM | POA: Diagnosis not present

## 2020-03-29 ENCOUNTER — Ambulatory Visit (INDEPENDENT_AMBULATORY_CARE_PROVIDER_SITE_OTHER): Payer: Medicare HMO | Admitting: Family Medicine

## 2020-03-29 ENCOUNTER — Other Ambulatory Visit: Payer: Self-pay

## 2020-03-29 ENCOUNTER — Encounter: Payer: Self-pay | Admitting: Family Medicine

## 2020-03-29 VITALS — BP 114/76 | HR 57 | Temp 98.8°F | Ht 64.0 in | Wt 170.0 lb

## 2020-03-29 DIAGNOSIS — Z780 Asymptomatic menopausal state: Secondary | ICD-10-CM

## 2020-03-29 DIAGNOSIS — Z0000 Encounter for general adult medical examination without abnormal findings: Secondary | ICD-10-CM | POA: Diagnosis not present

## 2020-03-29 DIAGNOSIS — Z1231 Encounter for screening mammogram for malignant neoplasm of breast: Secondary | ICD-10-CM | POA: Diagnosis not present

## 2020-03-29 NOTE — Patient Instructions (Signed)
Preventive Care 65 Years and Older, Female Preventive care refers to lifestyle choices and visits with your health care provider that can promote health and wellness. This includes:  A yearly physical exam. This is also called an annual well check.  Regular dental and eye exams.  Immunizations.  Screening for certain conditions.  Healthy lifestyle choices, such as diet and exercise. What can I expect for my preventive care visit? Physical exam Your health care provider will check:  Height and weight. These may be used to calculate body mass index (BMI), which is a measurement that tells if you are at a healthy weight.  Heart rate and blood pressure.  Your skin for abnormal spots. Counseling Your health care provider may ask you questions about:  Alcohol, tobacco, and drug use.  Emotional well-being.  Home and relationship well-being.  Sexual activity.  Eating habits.  History of falls.  Memory and ability to understand (cognition).  Work and work Statistician.  Pregnancy and menstrual history. What immunizations do I need?  Influenza (flu) vaccine  This is recommended every year. Tetanus, diphtheria, and pertussis (Tdap) vaccine  You may need a Td booster every 10 years. Varicella (chickenpox) vaccine  You may need this vaccine if you have not already been vaccinated. Zoster (shingles) vaccine  You may need this after age 65. Pneumococcal conjugate (PCV13) vaccine  One dose is recommended after age 65. Pneumococcal polysaccharide (PPSV23) vaccine  One dose is recommended after age 65. Measles, mumps, and rubella (MMR) vaccine  You may need at least one dose of MMR if you were born in 1957 or later. You may also need a second dose. Meningococcal conjugate (MenACWY) vaccine  You may need this if you have certain conditions. Hepatitis A vaccine  You may need this if you have certain conditions or if you travel or work in places where you may be exposed  to hepatitis A. Hepatitis B vaccine  You may need this if you have certain conditions or if you travel or work in places where you may be exposed to hepatitis B. Haemophilus influenzae type b (Hib) vaccine  You may need this if you have certain conditions. You may receive vaccines as individual doses or as more than one vaccine together in one shot (combination vaccines). Talk with your health care provider about the risks and benefits of combination vaccines. What tests do I need? Blood tests  Lipid and cholesterol levels. These may be checked every 5 years, or more frequently depending on your overall health.  Hepatitis C test.  Hepatitis B test. Screening  Lung cancer screening. You may have this screening every year starting at age 65 if you have a 30-pack-year history of smoking and currently smoke or have quit within the past 15 years.  Colorectal cancer screening. All adults should have this screening starting at age 65 and continuing until age 15. Your health care provider may recommend screening at age 65 if you are at increased risk. You will have tests every 1-10 years, depending on your results and the type of screening test.  Diabetes screening. This is done by checking your blood sugar (glucose) after you have not eaten for a while (fasting). You may have this done every 1-3 years.  Mammogram. This may be done every 1-2 years. Talk with your health care provider about how often you should have regular mammograms.  BRCA-related cancer screening. This may be done if you have a family history of breast, ovarian, tubal, or peritoneal cancers.  Other tests  Sexually transmitted disease (STD) testing.  Bone density scan. This is done to screen for osteoporosis. You may have this done starting at age 65. Follow these instructions at home: Eating and drinking  Eat a diet that includes fresh fruits and vegetables, whole grains, lean protein, and low-fat dairy products. Limit  your intake of foods with high amounts of sugar, saturated fats, and salt.  Take vitamin and mineral supplements as recommended by your health care provider.  Do not drink alcohol if your health care provider tells you not to drink.  If you drink alcohol: ? Limit how much you have to 0-1 drink a day. ? Be aware of how much alcohol is in your drink. In the [place] one drink equals one 12 oz bottle of beer (355 mL), one 5 oz glass of wine (148 mL), or one 1 oz glass of hard liquor (44 mL). Lifestyle  Take daily care of your teeth and gums.  Stay active. Exercise for at least 30 minutes on 5 or more days each week.  Do not use any products that contain nicotine or tobacco, such as cigarettes, e-cigarettes, and chewing tobacco. If you need help quitting, ask your health care provider.  If you are sexually active, practice safe sex. Use a condom or other form of protection in order to prevent STIs (sexually transmitted infections).  Talk with your health care provider about taking a low-dose aspirin or statin. What's next?  Go to your health care provider once a year for a well check visit.  Ask your health care provider how often you should have your eyes and teeth checked.  Stay up to date on all vaccines. This information is not intended to replace advice given to you by your health care provider. Make sure you discuss any questions you have with your health care provider. Document Revised: 05/16/2018 Document Reviewed: 05/16/2018 Elsevier Patient Education  2020 [employer].

## 2020-03-29 NOTE — Progress Notes (Signed)
Medicare Initial Preventative Physical Exam    Patient: Alison Kramer, Female    DOB: April 27, 1956, 64 y.o.   MRN: [id number] Visit Date: 03/29/2020  Today's Provider: Lavon Paganini, MD   Chief Complaint  Patient presents with  . Medicare Wellness   Subjective    Medicare Initial Preventative Physical Exam Alison Kramer is a 64 y.o. female who presents today for her Initial Preventative Physical Exam.   HPI  Social History   Socioeconomic History  . Marital status: Married    Spouse name: Sam  . Number of children: 2  . Years of education: 79  . Highest education level: Bachelor's degree (e.g., BA, AB, BS)  Occupational History  . Occupation: Recruitment consultant: [employer]  Tobacco Use  . Smoking status: Never Smoker  . Smokeless tobacco: Never Used  Vaping Use  . Vaping Use: Never used  Substance and Sexual Activity  . Alcohol use: Yes    Alcohol/week: 1.0 standard drink    Types: 1 Glasses of wine per week    Comment: "Most Nights"  . Drug use: No  . Sexual activity: Yes    Birth control/protection: None  Other Topics Concern  . Not on file  Social History Narrative  . Not on file   Social Determinants of Health   Financial Resource Strain:   . Difficulty of Paying Living Expenses: Not on file  Food Insecurity:   . Worried About Charity fundraiser in the Last Year: Not on file  . Ran Out of Food in the Last Year: Not on file  Transportation Needs:   . Lack of Transportation (Medical): Not on file  . Lack of Transportation (Non-Medical): Not on file  Physical Activity:   . Days of Exercise per Week: Not on file  . Minutes of Exercise per Session: Not on file  Stress:   . Feeling of Stress : Not on file  Social Connections:   . Frequency of Communication with Friends and Family: Not on file  . Frequency of Social Gatherings with Friends and Family: Not on file  . Attends Religious Services: Not on file  . Active Member of Clubs or  Organizations: Not on file  . Attends Archivist Meetings: Not on file  . Marital Status: Not on file  Intimate Partner Violence:   . Fear of Current or Ex-Partner: Not on file  . Emotionally Abused: Not on file  . Physically Abused: Not on file  . Sexually Abused: Not on file    Past Medical History:  Diagnosis Date  . ADHD (attention deficit hyperactivity disorder)   . Anxiety   . Colitis, ulcerative ([place])   . Depression      Patient Active Problem List   Diagnosis Date Noted  . Overweight 11/07/2019  . Insomnia 10/12/2015  . Allergic rhinitis 09/29/2015  . ADD (attention deficit disorder) 12/10/2014  . Ulcerative colitis, left sided, chronic ([place]) 11/22/2011  . Acute or subacute iridocyclitis 03/01/2011  . Colitis gravis ([place]) 03/01/2011    Past Surgical History:  Procedure Laterality Date  . ABDOMINAL HYSTERECTOMY  1981  . BACK SURGERY    . BUNIONECTOMY      Her family history includes Breast cancer in her mother; CAD in her father; Diabetes in her brother; Heart Problems in her brother and father; Hyperlipidemia in her mother; Hypertension in her brother; Obesity in her brother; Pancreatic disease in her father. There is no history of Colon  cancer.   Current Outpatient Medications:  .  cetirizine (ZYRTEC) 10 MG tablet, Take 1 tablet (10 mg total) by mouth daily., Disp: 90 tablet, Rfl: 1 .  cholecalciferol (VITAMIN D) 1000 UNITS tablet, Take 2,000 Units by mouth daily. , Disp: , Rfl:  .  diphenhydrAMINE (BENADRYL) 25 MG tablet, Take 25 mg by mouth at bedtime as needed., Disp: , Rfl:  .  estradiol (ESTRACE VAGINAL) 0.1 MG/GM vaginal cream, Place 1 Applicatorful vaginally 3 (three) times a week., Disp: 42.5 g, Rfl: 12 .  Omega-3 Fatty Acids (FISH OIL PO), Take 1,000 mg by mouth daily. , Disp: , Rfl:  .  omeprazole (PRILOSEC OTC) 20 MG tablet, Take 20 mg by mouth daily., Disp: , Rfl:  .  Probiotic Product (PROBIOTIC ADVANCED PO), Take 1 tablet by mouth daily.  , Disp: , Rfl:    Patient Care Team: Virginia Crews, MD as PCP - General (Family Medicine)  Review of Systems  Constitutional: Negative.   HENT: Negative.   Eyes: Negative.   Respiratory: Negative.   Cardiovascular: Negative.   Gastrointestinal: Negative.   Endocrine: Negative.   Genitourinary: Negative.   Musculoskeletal: Negative.   Skin: Negative.   Allergic/Immunologic: Negative.   Neurological: Negative.   Hematological: Negative.   Psychiatric/Behavioral: Negative.       Objective    Vitals: BP 114/76 (BP Location: Left Arm, Patient Position: Sitting, Cuff Size: Large)   Pulse (!) 57   Temp 98.8 F (37.1 C) (Oral)   Ht 5' 4"  (1.626 m)   Wt 170 lb (77.1 kg)   BMI 29.18 kg/m   Hearing Screening   125Hz  250Hz  500Hz  1000Hz  2000Hz  3000Hz  4000Hz  6000Hz  8000Hz   Right ear:           Left ear:             Visual Acuity Screening   Right eye Left eye Both eyes  Without correction:     With correction: 20/25 20/25 20/20       Physical Exam Vitals reviewed.  Constitutional:      General: She is not in acute distress.    Appearance: Normal appearance. She is well-developed. She is not diaphoretic.  HENT:     Head: Normocephalic and atraumatic.     Right Ear: Tympanic membrane, ear canal and external ear normal.     Left Ear: Tympanic membrane, ear canal and external ear normal.  Eyes:     General: No scleral icterus.    Conjunctiva/sclera: Conjunctivae normal.     Pupils: Pupils are equal, round, and reactive to light.  Neck:     Thyroid: No thyromegaly.  Cardiovascular:     Rate and Rhythm: Normal rate and regular rhythm.     Pulses: Normal pulses.     Heart sounds: Normal heart sounds. No murmur heard.   Pulmonary:     Effort: Pulmonary effort is normal. No respiratory distress.     Breath sounds: Normal breath sounds. No wheezing or rales.  Abdominal:     General: There is no distension.     Palpations: Abdomen is soft.     Tenderness: There is  no abdominal tenderness.  Musculoskeletal:        General: No deformity.     Cervical back: Neck supple.     Right lower leg: No edema.     Left lower leg: No edema.  Lymphadenopathy:     Cervical: No cervical adenopathy.  Skin:    General: Skin is warm  and dry.     Findings: No rash.  Neurological:     Mental Status: She is alert and oriented to person, place, and time. Mental status is at baseline.     Sensory: No sensory deficit.     Motor: No weakness.     Gait: Gait normal.  Psychiatric:        Mood and Affect: Mood normal.        Behavior: Behavior normal.        Thought Content: Thought content normal.    Activities of Daily Living In your present state of health, do you have any difficulty performing the following activities: 03/29/2020 11/06/2019  Hearing? N N  Vision? N N  Difficulty concentrating or making decisions? N N  Walking or climbing stairs? N N  Dressing or bathing? N N  Doing errands, shopping? N N  Some recent data might be hidden    Fall Risk Assessment Fall Risk  03/29/2020 10/12/2017  Falls in the past year? 0 No  Number falls in past yr: 0 -  Injury with Fall? 0 -  Risk for fall due to : No Fall Risks -  Follow up Falls evaluation completed -     Depression Screen PHQ 2/9 Scores 03/29/2020 10/12/2017 10/10/2016  PHQ - 2 Score 0 1 0  PHQ- 9 Score 1 - 3    6CIT Screen 03/29/2020  What Year? 0 points  What month? 0 points  What time? 0 points  Count back from 20 0 points  Months in reverse 0 points  Repeat phrase 2 points  Total Score 2    No results found for any visits on 03/29/20.  Assessment & Plan      Initial Preventative Physical Exam  Reviewed patient's Family Medical History Reviewed and updated list of patient's medical providers Assessment of cognitive impairment was done Assessed patient's functional ability Established a written schedule for health screening [place] Completed and  Reviewed  Exercise Activities and Dietary recommendations Goals   None     Immunization History  Administered Date(s) Administered  . Hepatitis B, adult 12/11/2017, 01/08/2018, 06/21/2018  . Influenza,inj,Quad PF,6+ Mos 04/12/2015, 04/01/2018  . Pneumococcal Polysaccharide-23 04/01/2018  . Td 06/21/2018  . Tdap 02/14/2008  . Zoster 05/12/2011  . Zoster Recombinat (Shingrix) 12/11/2017, 02/11/2018    Health Maintenance  Topic Date Due  . COVID-19 Vaccine (1) Never done  . MAMMOGRAM  04/06/2017  . COLONOSCOPY  07/05/2019  . PNA vac Low Risk Adult (1 of 2 - PCV13) 11/16/2019  . INFLUENZA VACCINE  09/02/2020 (Originally 01/04/2020)  . TETANUS/TDAP  06/21/2028  . DEXA SCAN  Completed  . Hepatitis C Screening  Completed  . HIV Screening  Completed     Discussed health benefits of physical activity, and encouraged her to engage in regular exercise appropriate for her age and condition.   Problem List Items Addressed This Visit    None    Visit Diagnoses    Welcome to Medicare preventive visit    -  Primary   Relevant Orders   EKG 12-Lead (Completed)   Postmenopausal       Relevant Orders   DG Bone Density   Breast cancer screening by mammogram       Relevant Orders   MM 3D SCREEN BREAST BILATERAL       Return in about 1 year (around 03/29/2021) for CPE, AWV.     I, Lavon Paganini, MD, have reviewed all documentation  for this visit. The documentation on 03/29/20 for the exam, diagnosis, procedures, and orders are all accurate and complete.   Daiquan Resnik, Dionne Bucy, MD, MPH [facility]

## 2020-05-26 DIAGNOSIS — H532 Diplopia: Secondary | ICD-10-CM | POA: Diagnosis not present

## 2020-06-01 DIAGNOSIS — Z961 Presence of intraocular lens: Secondary | ICD-10-CM | POA: Diagnosis not present

## 2020-06-24 ENCOUNTER — Other Ambulatory Visit: Payer: Self-pay

## 2020-07-19 ENCOUNTER — Ambulatory Visit
Admission: RE | Admit: 2020-07-19 | Discharge: 2020-07-19 | Disposition: A | Discharge status: Home / Self Care | Payer: Medicare HMO | Source: Ambulatory Visit | Attending: Family Medicine | Admitting: Family Medicine

## 2020-07-19 ENCOUNTER — Other Ambulatory Visit: Payer: Self-pay

## 2020-07-19 DIAGNOSIS — M85831 Other specified disorders of bone density and structure, right forearm: Secondary | ICD-10-CM | POA: Diagnosis not present

## 2020-07-19 DIAGNOSIS — M85851 Other specified disorders of bone density and structure, right thigh: Secondary | ICD-10-CM | POA: Diagnosis not present

## 2020-07-19 DIAGNOSIS — Z1231 Encounter for screening mammogram for malignant neoplasm of breast: Secondary | ICD-10-CM | POA: Insufficient documentation

## 2020-07-19 DIAGNOSIS — Z780 Asymptomatic menopausal state: Secondary | ICD-10-CM | POA: Insufficient documentation

## 2020-07-23 ENCOUNTER — Ambulatory Visit: Payer: Self-pay | Admitting: *Deleted

## 2020-07-23 NOTE — Telephone Encounter (Signed)
  PT wanted to know what dosage of Calcium she should be taking. Please advise.   Patient requesting to know a specific dose for a calcium supplement that she should be taking. Patient reports she got a calcium supplement with vit D in it and is taking a prescribed vit D. Please advise if 1000 mg - 1200 mg or more of calcium is sufficient for patient. Patient verbalized if she has further questions she will call back.   Reason for Disposition . [1] Caller requesting NON-URGENT health information AND [2] PCP's office is the best resource  Answer Assessment - Initial Assessment Questions 1. REASON FOR CALL or QUESTION: "What is your reason for calling today?" or "How can I best help you?" or "What question do you have that I can help answer?"     Patient asking what dosage of calcium should she be taking ?  Protocols used: INFORMATION ONLY CALL - NO TRIAGE-A-AH

## 2020-07-23 NOTE — Telephone Encounter (Signed)
Patient advised as below.  Bone density scan shows osteopenia (this is some bone loss, but not as bad as osteoporosis).  Recommend regular weight bearing exercise, avoiding smoking, and adequate Ca (1251m/day) and Vit D (1000 units daily) via diet or supplement.  We will recheck in 2 years to ensure this hasn't worsened.

## 2020-08-22 ENCOUNTER — Other Ambulatory Visit: Payer: Self-pay | Admitting: Family Medicine

## 2020-08-22 NOTE — Telephone Encounter (Signed)
Requested Prescriptions  Pending Prescriptions Disp Refills  . estradiol (ESTRACE) 0.1 MG/GM vaginal cream [Pharmacy Med Name: ESTRADIOL 0.01% VAGINAL CREAM-APPL] 42.5 g 12    Sig: PLACE 1 APPLICATORFUL VAGINALLY THREE TIMES A WEEK     OB/GYN:  Estrogens Passed - 08/22/2020 12:25 PM      Passed - Mammogram is up-to-date per Health Maintenance      Passed - Last BP in normal range    BP Readings from Last 1 Encounters:  03/29/20 114/76         Passed - Valid encounter within last 12 months    Recent Outpatient Visits          4 months ago Welcome to [employer] preventive visit   [facility], Dionne Bucy, MD   9 months ago Attention deficit disorder, unspecified hyperactivity presence   [facility], Dionne Bucy, MD   1 year ago Atrophic vaginitis   [facility], Dionne Bucy, MD   2 years ago Attention deficit disorder, unspecified hyperactivity presence   [facility], Dionne Bucy, MD   2 years ago Need for shingles vaccine   [facility], Dionne Bucy, MD

## 2020-11-25 DIAGNOSIS — M1711 Unilateral primary osteoarthritis, right knee: Secondary | ICD-10-CM | POA: Diagnosis not present

## 2020-12-03 DIAGNOSIS — M1711 Unilateral primary osteoarthritis, right knee: Secondary | ICD-10-CM | POA: Diagnosis not present

## 2021-03-10 DIAGNOSIS — M1711 Unilateral primary osteoarthritis, right knee: Secondary | ICD-10-CM | POA: Diagnosis not present

## 2021-03-12 DIAGNOSIS — M1711 Unilateral primary osteoarthritis, right knee: Secondary | ICD-10-CM | POA: Insufficient documentation

## 2021-04-04 ENCOUNTER — Other Ambulatory Visit: Payer: Self-pay

## 2021-04-04 ENCOUNTER — Encounter: Payer: Self-pay | Admitting: Family Medicine

## 2021-04-04 ENCOUNTER — Ambulatory Visit (INDEPENDENT_AMBULATORY_CARE_PROVIDER_SITE_OTHER): Payer: Medicare HMO | Admitting: Family Medicine

## 2021-04-04 ENCOUNTER — Ambulatory Visit: Payer: Medicare HMO

## 2021-04-04 VITALS — BP 126/60 | HR 87 | Temp 97.6°F | Resp 16 | Ht 64.0 in | Wt 192.4 lb

## 2021-04-04 DIAGNOSIS — Z0000 Encounter for general adult medical examination without abnormal findings: Secondary | ICD-10-CM | POA: Insufficient documentation

## 2021-04-04 DIAGNOSIS — Z1231 Encounter for screening mammogram for malignant neoplasm of breast: Secondary | ICD-10-CM

## 2021-04-04 NOTE — Progress Notes (Signed)
Annual Wellness Visit     Patient: Alison Kramer, Female    DOB: December 27, 1955, 65 y.o.   MRN: [id number] Visit Date: 04/04/2021  Today's Provider: Lavon Paganini, MD   Chief Complaint  Patient presents with   Medicare Wellness   Subjective    [name] is a 65 y.o. female who presents today for her Annual Wellness Visit. She reports consuming a  gluten-free  diet that she started last week. The patient does not participate in regular exercise at present due to her knee. Patient is scheduled for right total knee replacement on 05/20/21 with Dr. Marry Guan. She generally feels fairly well. She reports sleeping poorly. She reports that she has insomnia due to stress. She does not have additional problems to discuss today.    Medications: Outpatient Medications Prior to Visit  Medication Sig   Ascorbic Acid (VITAMIN C PO) Take 1 tablet by mouth daily.   cetirizine (ZYRTEC) 10 MG tablet Take 1 tablet (10 mg total) by mouth daily.   Cholecalciferol 25 MCG (1000 UT) tablet Take by mouth.   Coenzyme Q10 100 MG capsule Take by mouth.   estradiol (ESTRACE) 0.1 MG/GM vaginal cream PLACE 1 APPLICATORFUL VAGINALLY THREE TIMES A WEEK   Misc Natural Products (HORNY GOAT WEED PO) Take 1 tablet by mouth daily.   Omega-3 Fatty Acids (FISH OIL PO) Take 1,000 mg by mouth daily.    omeprazole (PRILOSEC OTC) 20 MG tablet Take 20 mg by mouth daily.   Probiotic Product (PROBIOTIC ADVANCED PO) Take 1 tablet by mouth daily.    RESVERATROL PO Take 1 tablet by mouth daily.   Turmeric 400 MG CAPS Take by mouth.   Zinc Acetate, Oral, (ZINC ACETATE PO) Take 1 tablet by mouth daily.   [DISCONTINUED] cholecalciferol (VITAMIN D) 1000 UNITS tablet Take 2,000 Units by mouth daily.    [DISCONTINUED] diphenhydrAMINE (BENADRYL) 25 MG tablet Take 25 mg by mouth at bedtime as needed.   No facility-administered medications prior to visit.    No Known Allergies  Patient Care Team: Virginia Crews, MD as  PCP - General (Family Medicine)  Review of Systems  Constitutional:  Positive for fatigue.  HENT:  Positive for congestion, rhinorrhea, sneezing and tinnitus.   Gastrointestinal:  Positive for abdominal pain and blood in stool.  Musculoskeletal:  Positive for arthralgias, gait problem and joint swelling.       Right knee  Allergic/Immunologic: Positive for environmental allergies.  Psychiatric/Behavioral:  Positive for sleep disturbance.   All other systems reviewed and are negative.      Objective    Vitals: BP 126/60 (BP Location: Left Arm, Patient Position: Sitting, Cuff Size: Large)   Pulse 87   Temp 97.6 F (36.4 C) (Temporal)   Resp 16   Ht 5' 4"  (1.626 m)   Wt 192 lb 6.4 oz (87.3 kg)   SpO2 99%   BMI 33.03 kg/m    Physical Exam Vitals reviewed.  Constitutional:      General: She is not in acute distress.    Appearance: Normal appearance. She is not ill-appearing or toxic-appearing.  HENT:     Head: Normocephalic and atraumatic.     Right Ear: External ear normal.     Left Ear: External ear normal.     Nose: Nose normal.     Mouth/Throat:     Mouth: Mucous membranes are moist.     Pharynx: Oropharynx is clear. No oropharyngeal exudate or posterior oropharyngeal erythema.  Eyes:     General: No scleral icterus.    Extraocular Movements: Extraocular movements intact.     Conjunctiva/sclera: Conjunctivae normal.     Pupils: Pupils are equal, round, and reactive to light.  Cardiovascular:     Rate and Rhythm: Normal rate and regular rhythm.     Pulses: Normal pulses.     Heart sounds: Normal heart sounds. No murmur heard.   No friction rub. No gallop.  Pulmonary:     Effort: Pulmonary effort is normal. No respiratory distress.     Breath sounds: Normal breath sounds. No wheezing or rhonchi.  Chest:     Chest wall: No tenderness.  Abdominal:     General: Abdomen is flat. There is no distension.     Palpations: Abdomen is soft.  Musculoskeletal:         General: Normal range of motion.     Cervical back: Normal range of motion and neck supple.     Right lower leg: No edema.     Left lower leg: No edema.  Skin:    General: Skin is warm and dry.     Capillary Refill: Capillary refill takes less than 2 seconds.     Findings: No lesion or rash.  Neurological:     General: No focal deficit present.     Mental Status: She is alert and oriented to person, place, and time. Mental status is at baseline.  Psychiatric:        Mood and Affect: Mood normal.    Most recent functional status assessment: In your present state of health, do you have any difficulty performing the following activities: 04/04/2021  Hearing? N  Vision? N  Difficulty concentrating or making decisions? N  Walking or climbing stairs? Y  Dressing or bathing? N  Doing errands, shopping? N  Some recent data might be hidden   Most recent fall risk assessment: Fall Risk  04/04/2021  Falls in the past year? 0  Number falls in past yr: 0  Injury with Fall? 0  Risk for fall due to : No Fall Risks  Follow up Falls evaluation completed    Most recent depression screenings: PHQ 2/9 Scores 04/04/2021 03/29/2020  PHQ - 2 Score 0 0  PHQ- 9 Score 7 1   Most recent cognitive screening: 6CIT Screen 04/04/2021  What Year? 0 points  What month? 0 points  What time? 0 points  Count back from 20 0 points  Months in reverse 0 points  Repeat phrase 0 points  Total Score 0   Most recent Audit-C alcohol use screening Alcohol Use Disorder Test (AUDIT) 04/04/2021  1. How often do you have a drink containing alcohol? 0  2. How many drinks containing alcohol do you have on a typical day when you are drinking? 0  3. How often do you have six or more drinks on one occasion? 0  AUDIT-C Score 0  Alcohol Brief Interventions/Follow-up -   A score of 3 or more in women, and 4 or more in men indicates increased risk for alcohol abuse, EXCEPT if all of the points are from question 1    No results found for any visits on 04/04/21.  Assessment & Plan     Annual wellness visit done today including the all of the following: Reviewed patient's Family Medical History Reviewed and updated list of patient's medical providers Assessment of cognitive impairment was done Assessed patient's functional ability Established a written schedule for health screening services  Health Risk Assessent Completed and Reviewed  Exercise Activities and Dietary recommendations  Goals   None     Immunization History  Administered Date(s) Administered   Hepatitis B, adult 12/11/2017, 01/08/2018, 06/21/2018   Influenza,inj,Quad PF,6+ Mos 04/12/2015, 04/01/2018   Pneumococcal Polysaccharide-23 04/01/2018   Td 06/21/2018   Tdap 02/14/2008   Zoster Recombinat (Shingrix) 12/11/2017, 02/11/2018   Zoster, Live 05/12/2011    Health Maintenance  Topic Date Due   Pneumonia Vaccine 61+ Years old (2 - PCV) 04/02/2019   COLONOSCOPY (Pts 45-37yr Insurance coverage will need to be confirmed)  07/05/2019   COVID-19 Vaccine (1) 04/20/2021 (Originally 05/18/1956)   INFLUENZA VACCINE  09/02/2021 (Originally 01/03/2021)   MAMMOGRAM  07/19/2022   TETANUS/TDAP  06/21/2028   DEXA SCAN  Completed   Hepatitis C Screening  Completed   Zoster Vaccines- Shingrix  Completed   HPV VACCINES  Aged Out     Discussed health benefits of physical activity, and encouraged her to engage in regular exercise appropriate for her age and condition.    Problem List Items Addressed This Visit       Other   Encounter for annual wellness visit (AWV) in Medicare patient - Primary    Discussed continuing gluten-free diet and importance of exercise Provided information about Pneumonia vaccine for 65+       Other Visit Diagnoses     Encounter for annual physical exam       Relevant Orders   Lipid panel   Comprehensive metabolic panel   CBC   Screening mammogram for breast cancer       Relevant Orders   MM 3D  SCREEN BREAST BILATERAL        Return in about 1 year (around 04/04/2022) for AWV, CPE.    ENelva Nay Medical Student 04/04/2021, 3:00 PM  Patient seen along with MS3 student ENelva Nay I personally evaluated this patient along with the student, and verified all aspects of the history, physical exam, and medical decision making as documented by the student. I agree with the student's documentation and have made all necessary edits.  Rochele Lueck, ADionne Bucy MD, MPH [facility]

## 2021-04-04 NOTE — Assessment & Plan Note (Signed)
Discussed continuing gluten-free diet and importance of exercise Provided information about Pneumonia vaccine for 65+

## 2021-04-18 DIAGNOSIS — D849 Immunodeficiency, unspecified: Secondary | ICD-10-CM | POA: Diagnosis not present

## 2021-04-18 DIAGNOSIS — E039 Hypothyroidism, unspecified: Secondary | ICD-10-CM | POA: Diagnosis not present

## 2021-04-18 DIAGNOSIS — H0510 Unspecified chronic inflammatory disorders of orbit: Secondary | ICD-10-CM | POA: Diagnosis not present

## 2021-04-18 DIAGNOSIS — D513 Other dietary vitamin B12 deficiency anemia: Secondary | ICD-10-CM | POA: Diagnosis not present

## 2021-04-18 DIAGNOSIS — Z0000 Encounter for general adult medical examination without abnormal findings: Secondary | ICD-10-CM | POA: Diagnosis not present

## 2021-04-19 LAB — COMPREHENSIVE METABOLIC PANEL
ALT: 15 IU/L (ref 0–32)
AST: 19 IU/L (ref 0–40)
Albumin/Globulin Ratio: 2 (ref 1.2–2.2)
Albumin: 4.6 g/dL (ref 3.8–4.8)
Alkaline Phosphatase: 56 IU/L (ref 44–121)
BUN/Creatinine Ratio: 19 (ref 12–28)
BUN: 19 mg/dL (ref 8–27)
Bilirubin Total: 0.4 mg/dL (ref 0.0–1.2)
CO2: 23 mmol/L (ref 20–29)
Calcium: 9.4 mg/dL (ref 8.7–10.3)
Chloride: 105 mmol/L (ref 96–106)
Creatinine, Ser: 1.02 mg/dL — ABNORMAL HIGH (ref 0.57–1.00)
Globulin, Total: 2.3 g/dL (ref 1.5–4.5)
Glucose: 87 mg/dL (ref 70–99)
Potassium: 4.3 mmol/L (ref 3.5–5.2)
Sodium: 141 mmol/L (ref 134–144)
Total Protein: 6.9 g/dL (ref 6.0–8.5)
eGFR: 61 mL/min/{1.73_m2} (ref >59)

## 2021-04-19 LAB — CBC
Hematocrit: 39.5 % (ref 34.0–46.6)
Hemoglobin: 13.1 g/dL (ref 11.1–15.9)
MCH: 30.5 pg (ref 26.6–33.0)
MCHC: 33.2 g/dL (ref 31.5–35.7)
MCV: 92 fL (ref 79–97)
Platelets: 341 10*3/uL (ref 150–450)
RBC: 4.3 x10E6/uL (ref 3.77–5.28)
RDW: 12.1 % (ref 11.7–15.4)
WBC: 7.3 10*3/uL (ref 3.4–10.8)

## 2021-04-19 LAB — LIPID PANEL
Chol/HDL Ratio: 3.5 ratio (ref 0.0–4.4)
Cholesterol, Total: 185 mg/dL (ref 100–199)
HDL: 53 mg/dL (ref >39)
LDL Chol Calc (NIH): 113 mg/dL — ABNORMAL HIGH (ref 0–99)
Triglycerides: 105 mg/dL (ref 0–149)
VLDL Cholesterol Cal: 19 mg/dL (ref 5–40)

## 2021-04-26 NOTE — Discharge Instructions (Signed)
Instructions after Total Knee Replacement   Alison Kramer, Jr., M.D.     Dept. of Orthopaedics & Sports Medicine  [facility]  [address]  [place], [place]  [address]  Phone: [phone]   Fax: [phone]    DIET: Drink plenty of non-alcoholic fluids. Resume your normal diet. Include foods high in fiber.  ACTIVITY:  You may use crutches or a walker with weight-bearing as tolerated, unless instructed otherwise. You may be weaned off of the walker or crutches by your Physical Therapist.  Do NOT place pillows under the knee. Anything placed under the knee could limit your ability to straighten the knee.   Continue doing gentle exercises. Exercising will reduce the pain and swelling, increase motion, and prevent muscle weakness.   Please continue to use the TED compression stockings for 6 weeks. You may remove the stockings at night, but should reapply them in the morning. Do not drive or operate any equipment until instructed.  WOUND CARE:  Continue to use the PolarCare or ice packs periodically to reduce pain and swelling. You may bathe or shower after the staples are removed at the first office visit following surgery.  MEDICATIONS: You may resume your regular medications. Please take the pain medication as prescribed on the medication. Do not take pain medication on an empty stomach. You have been given a prescription for a blood thinner (Lovenox or Coumadin). Please take the medication as instructed. (NOTE: After completing a 2 week course of Lovenox, take one Enteric-coated aspirin once a day. This along with elevation will help reduce the possibility of phlebitis in your operated leg.) Do not drive or drink alcoholic beverages when taking pain medications.  CALL THE OFFICE FOR: Temperature above 101 degrees Excessive bleeding or drainage on the dressing. Excessive swelling, coldness, or paleness of the toes. Persistent nausea and vomiting.  FOLLOW-UP:  You  should have an appointment to return to the office in 10-14 days after surgery. Arrangements have been made for continuation of Physical Therapy (either home therapy or outpatient therapy).   [facility] Department Directory         [email or web address]       [email or web address]          Cardiology  Appointments: [place] - [phone] [place] - [phone]  Endocrinology  Appointments: [place] - [phone] [place] - [phone]  Gastroenterology  Appointments: [place] - [phone] [place] - [phone]        General Surgery   Appointments: [place] - [phone]  Internal Medicine/Family Medicine  Appointments: [place] - [phone] [place] - [phone] [place] - [phone]  Metabolic and Weigh Loss Surgery  Appointments: [place] - [phone]        Neurology  Appointments: [place] - [phone] [place] - [phone]  Neurosurgery  Appointments: [place] - [phone]  Obstetrics & Gynecology  Appointments: [place] - [phone] [place] - [phone]        Pediatrics  Appointments: [place] - [phone] [place] - [phone]  Physiatry  Appointments: [place] -[phone]  Physical Therapy  Appointments: [place] - [phone] [place] - [phone]        Podiatry  Appointments: [place] - [phone] [place] - [phone]  Pulmonology  Appointments: [place] - [phone]  Rheumatology  Appointments: [place] - [phone]        [place] Location: [facility]  [address] , [place]  [address]  [place] Location: [facility] [address] [place], [place]  [address]  [place] Location: [facility] [address] [place], [place]  [address]    

## 2021-05-09 ENCOUNTER — Other Ambulatory Visit: Payer: Self-pay

## 2021-05-09 ENCOUNTER — Encounter
Admission: RE | Admit: 2021-05-09 | Discharge: 2021-05-09 | Disposition: A | Discharge status: Home / Self Care | Payer: Medicare HMO | Source: Ambulatory Visit | Attending: Orthopedic Surgery | Admitting: Orthopedic Surgery

## 2021-05-09 VITALS — BP 115/70 | HR 75 | Resp 15 | Ht 64.0 in | Wt 192.0 lb

## 2021-05-09 DIAGNOSIS — Z01818 Encounter for other preprocedural examination: Secondary | ICD-10-CM | POA: Insufficient documentation

## 2021-05-09 DIAGNOSIS — K5150 Left sided colitis without complications: Secondary | ICD-10-CM | POA: Insufficient documentation

## 2021-05-09 DIAGNOSIS — Z01810 Encounter for preprocedural cardiovascular examination: Secondary | ICD-10-CM | POA: Diagnosis not present

## 2021-05-09 DIAGNOSIS — M1711 Unilateral primary osteoarthritis, right knee: Secondary | ICD-10-CM | POA: Diagnosis not present

## 2021-05-09 HISTORY — DX: Gastro-esophageal reflux disease without esophagitis: K21.9

## 2021-05-09 LAB — TYPE AND SCREEN
ABO/RH(D): AB POS
Antibody Screen: NEGATIVE

## 2021-05-09 LAB — URINALYSIS, ROUTINE W REFLEX MICROSCOPIC
Bilirubin Urine: NEGATIVE
Glucose, UA: NEGATIVE mg/dL
Hgb urine dipstick: NEGATIVE
Ketones, ur: NEGATIVE mg/dL
Leukocytes,Ua: NEGATIVE
Nitrite: NEGATIVE
Protein, ur: NEGATIVE mg/dL
Specific Gravity, Urine: 1.019 (ref 1.005–1.030)
pH: 5 (ref 5.0–8.0)

## 2021-05-09 LAB — SURGICAL PCR SCREEN
MRSA, PCR: NEGATIVE
Staphylococcus aureus: NEGATIVE

## 2021-05-09 LAB — APTT: aPTT: 30 seconds (ref 24–36)

## 2021-05-09 LAB — PROTIME-INR
INR: 1 (ref 0.8–1.2)
Prothrombin Time: 12.9 seconds (ref 11.4–15.2)

## 2021-05-09 NOTE — Patient Instructions (Signed)
Your procedure is scheduled on: 05/20/21 Report to [place]. To find out your arrival time please call [phone] between 1PM - 3PM on 12/15.  Remember: Instructions that are not followed completely may result in serious medical risk, up to and including death, or upon the discretion of your surgeon and anesthesiologist your surgery may need to be rescheduled.     _X__ 1. Do not eat food after midnight the night before your procedure.                 No gum chewing or hard candies. You may drink clear liquids up to 2 hours                 before you are scheduled to arrive for your surgery- DO not drink clear                 liquids within 2 hours of the start of your surgery.                 Clear Liquids include:  water, apple juice without pulp, clear carbohydrate                 drink such as Clearfast or Gatorade, Black Coffee or Tea (Do not add                 anything to coffee or tea). Diabetics water only  __X__2.  On the morning of surgery brush your teeth with toothpaste and water, you                 may rinse your mouth with mouthwash if you wish.  Do not swallow any              toothpaste of mouthwash.     _X__ 3.  No Alcohol for 24 hours before or after surgery.   _X__ 4.  Do Not Smoke or use e-cigarettes For 24 Hours Prior to Your Surgery.                 Do not use any chewable tobacco products for at least 6 hours prior to                 surgery.  ____  5.  Bring all medications with you on the day of surgery if instructed.   __X__  6.  Notify your doctor if there is any change in your medical condition      (cold, fever, infections).     Do not wear jewelry, make-up, hairpins, clips or nail polish. Do not wear lotions, powders, or perfumes.  Do not shave body hair 48 hours prior to surgery. Men may shave face and neck. Do not bring valuables to the hospital.    [facility] is not responsible for any  belongings or valuables.  Contacts, dentures/partials or body piercings may not be worn into surgery. Bring a case for your contacts, glasses or hearing aids, a denture cup will be supplied. Leave your suitcase in the car. After surgery it may be brought to your room. For patients admitted to the hospital, discharge time is determined by your treatment team.   Patients discharged the day of surgery will not be allowed to drive home.   Please read over the following fact sheets that you were given:   MRSA Information, CHG soap, EMMI, Incentive Spirometer, Ensure  __X__ Take these medicines the morning of  surgery with A SIP OF WATER:    1. cetirizine (ZYRTEC) 10 MG tablet  2. omeprazole (PRILOSEC OTC) 20 MG tablet  3.   4.  5.  6.  ____ Fleet Enema (as directed)   __X__ Use CHG Soap/SAGE wipes as directed  ____ Use inhalers on the day of surgery  ____ Stop metformin/Janumet/Farxiga 2 days prior to surgery    ____ Take 1/2 of usual insulin dose the night before surgery. No insulin the morning          of surgery.   ____ Stop Blood Thinners Coumadin/Plavix/Xarelto/Pleta/Pradaxa/Eliquis/Effient/Aspirin  on   Or contact your Surgeon, Cardiologist or Medical Doctor regarding  ability to stop your blood thinners  __X__ Stop Anti-inflammatories 7 days before surgery such as Advil, Ibuprofen, Motrin,  BC or Goodies Powder, Naprosyn, Naproxen, Aleve, Aspirin    __X__ Stop all herbal supplements, fish oil or vitamin E until after surgery.    ____ Bring C-Pap to the hospital.

## 2021-05-15 ENCOUNTER — Encounter: Payer: Self-pay | Admitting: Orthopedic Surgery

## 2021-05-15 NOTE — H&P (Signed)
ORTHOPAEDIC HISTORY & PHYSICAL Alison Kramer, [place] - 05/10/2021 1:15 PM EST Formatting of this note is different from the original. Alison Kramer MEDICINE Chief Complaint:   Chief Complaint  Patient presents with   Right Knee - Pain  History & Physical Right TKA 05/20/21 [facility]   History of Present Illness:   Alison Kramer is a 65 y.o. female that presents to clinic today for her preoperative history and evaluation. Patient presents unaccompanied. The patient is scheduled to undergo a right total knee arthroplasty on 05/20/21 by Dr. Marry Guan. Her pain began several years ago. The pain is located primarily along the medial aspect of the knee. She describes her pain as worse with weightbearing. She reports associated swelling with some giving way of the knee. She denies associated numbness or tingling, denies locking.   The patient's symptoms have progressed to the point that they decrease her quality of life. The patient has previously undergone conservative treatment including NSAIDS and injections to the knee without adequate control of her symptoms.  Denies history of lumbar fusion, blood clots, or significant cardiac history. No significant drug allergies.  Past Medical, Surgical, Family, Social History, Allergies, Medications:   Past Medical History:  Past Medical History:  Diagnosis Date   ADHD (attention deficit hyperactivity disorder)   CATARACT   History of chicken pox   Iritis   Osteoarthritis   Ulcerative colitis, chronic (CMS-HCC)   Past Surgical History:  Past Surgical History:  Procedure Laterality Date   BACK SURGERY 2008   BUNIONECTOMY   CATARACT EXTRACTION   COLONOSCOPY W/BIOPSY N/A 08/14/2014  Procedure: Colonoscopy ; Surgeon: Erin Sons, MD; Location: [facility]; Service: Gastroenterology; Laterality: N/A;   COLONOSCOPY W/BIOPSY N/A 10/05/2017  Procedure: Colonoscopy; Surgeon: Erin Sons, MD;  Location: [facility]; Service: Gastroenterology; Laterality: N/A;   COLONOSCOPY W/BIOPSY N/A 06/07/2018  Procedure: Colonoscopy; Surgeon: Erin Sons, MD; Location: [facility]; Service: Gastroenterology; Laterality: N/A;   HYSTERECTOMY 1981   Current Medications:  Current Outpatient Medications  Medication Sig Dispense Refill   calcium carbonate-vitamin D3 (CALTRATE 600+D) 600 mg-10 mcg (400 unit) tablet Take by mouth   cetirizine (ZYRTEC) 10 MG tablet Take 10 mg by mouth every morning   cholecalciferol (VITAMIN D3) 1,000 unit tablet Take 2,000 Units by mouth every morning   co-enzyme Q-10, ubiquinone, 100 mg capsule Take 100 mg by mouth once daily   docosahexaenoic acid-epa 120-180 mg Cap Take by mouth   estradioL (ESTRACE) 0.01 % (0.1 mg/gram) vaginal cream PLACE 1 APPLICATORFUL VAGINALLY THREE TIMES A WEEK   Lactobac 2-Bifido 1-S. therm 112.5 billion cell Cap Take 1 capsule by mouth once daily   NON FORMULARY Apply topically   omega-3 fatty acids-fish oil 360-1,200 mg Cap Take by mouth every morning   phenylephrine-acetaminophen 5-325 mg Cap Take by mouth   resveratroL 250 mg Cap Take 1 tablet by mouth once daily   turmeric 400 mg Cap Take 800 mg by mouth once daily   zinc acetate 25 mg (zinc) Cap Take 1 tablet by mouth once daily   No current facility-administered medications for this visit.   Allergies: No Known Allergies  Social History:  Social History   Socioeconomic History   Marital status: Married  Spouse name: Sam   Number of children: 2  Occupational History   Occupation: Retired  Tobacco Use   Smoking status: Never   Smokeless tobacco: Never  Vaping Use   Vaping Use: Never used  Substance and Sexual Activity   Alcohol use: Not Currently   Drug use: No   Sexual activity: Defer  Partners: Male  Social History Narrative  [name] is employed at [employer]   Family History:  Family History  Problem Relation Age of Onset    Hyperlipidemia (Elevated cholesterol) Mother   Diverticulosis Mother   Kidney disease Father   Heart disease Father   Heart disease Brother   Ulcerative colitis Son   Anesthesia problems Neg Hx   Review of Systems:   A 10+ ROS was performed, reviewed, and the pertinent orthopaedic findings are documented in the HPI.   Physical Examination:   BP 122/80 (BP Location: Left upper arm, Patient Position: Sitting, BP Cuff Size: Adult)  Ht 162.6 cm (5' 4" )  Wt 86.8 kg (191 lb 6.4 oz)  BMI 32.85 kg/m   Patient is a well-developed, well-nourished female in no acute distress. Patient has normal mood and affect. Patient is alert and oriented to person, place, and time.   HEENT: Atraumatic, normocephalic. Pupils equal and reactive to light. Extraocular motion intact. Noninjected sclera.  Cardiovascular: Regular rate and rhythm, with no murmurs, rubs, or gallops. Distal pulses palpable.  Respiratory: Lungs clear to auscultation bilaterally.   Right Knee: Soft tissue swelling: minimal Effusion: none Erythema: none Crepitance: mild Tenderness: medial Alignment: relative varus Mediolateral laxity: medial pseudolaxity Posterior sag: negative Patellar tracking: Good tracking without evidence of subluxation or tilt Atrophy: No significant atrophy.  Quadriceps tone was fair to good. Range of motion: 0/0/122 degrees  Sensation intact over the saphenous, lateral sural cutaneous, superficial fibular, and deep fibular nerve distributions.  Tests Performed/Reviewed:  X-rays  3 views of the right knee were obtained. Images reveal severe loss of medial compartment joint space with significant osteophyte formation. No fractures or dislocations. No osseous abnormality noted.  Impression:   ICD-10-CM  1. Primary osteoarthritis of right knee M17.11   Plan:   The patient has end-stage degenerative changes of the right knee. It was explained to the patient that the condition is progressive in  nature. Having failed conservative treatment, the patient has elected to proceed with a total joint arthroplasty. The patient will undergo a total joint arthroplasty with Dr. Marry Guan. The risks of surgery, including blood clot and infection, were discussed with the patient. Measures to reduce these risks, including the use of anticoagulation, perioperative antibiotics, and early ambulation were discussed. The importance of postoperative physical therapy was discussed with the patient. The patient elects to proceed with surgery. The patient is instructed to stop all blood thinners prior to surgery. The patient is instructed to call the hospital the day before surgery to learn of the proper arrival time.   Contact our office with any questions or concerns. Follow up as indicated, or sooner should any new problems arise, if conditions worsen, or if they are otherwise concerned.   Alison Kramer, [facility] [place], [place] [address] Phone: [phone]  This note was generated in part with voice recognition software and I apologize for any typographical errors that were not detected and corrected.  Electronically signed by Alison Fudge, PA at 05/10/2021 5:25 PM EST

## 2021-05-18 ENCOUNTER — Other Ambulatory Visit: Payer: Self-pay

## 2021-05-18 ENCOUNTER — Other Ambulatory Visit
Admission: RE | Admit: 2021-05-18 | Discharge: 2021-05-18 | Disposition: A | Discharge status: Home / Self Care | Payer: Medicare HMO | Source: Ambulatory Visit | Attending: Orthopedic Surgery | Admitting: Orthopedic Surgery

## 2021-05-18 DIAGNOSIS — Z20822 Contact with and (suspected) exposure to covid-19: Secondary | ICD-10-CM

## 2021-05-18 DIAGNOSIS — Z01812 Encounter for preprocedural laboratory examination: Secondary | ICD-10-CM | POA: Insufficient documentation

## 2021-05-18 LAB — SARS CORONAVIRUS 2 (TAT 6-24 HRS): SARS Coronavirus 2: NEGATIVE

## 2021-05-20 ENCOUNTER — Observation Stay
Admission: RE | Admit: 2021-05-20 | Discharge: 2021-05-21 | Disposition: A | Discharge status: Home / Self Care | Payer: Medicare HMO | Source: Ambulatory Visit | Attending: Orthopedic Surgery | Admitting: Orthopedic Surgery

## 2021-05-20 ENCOUNTER — Encounter: Payer: Self-pay | Admitting: Orthopedic Surgery

## 2021-05-20 ENCOUNTER — Encounter
Admission: RE | Disposition: A | Discharge status: Home / Self Care | Payer: Self-pay | Source: Ambulatory Visit | Attending: Orthopedic Surgery

## 2021-05-20 ENCOUNTER — Ambulatory Visit: Payer: Medicare HMO | Admitting: Urgent Care

## 2021-05-20 ENCOUNTER — Ambulatory Visit: Payer: Medicare HMO | Admitting: Registered Nurse

## 2021-05-20 ENCOUNTER — Observation Stay: Payer: Medicare HMO

## 2021-05-20 ENCOUNTER — Other Ambulatory Visit: Payer: Self-pay

## 2021-05-20 DIAGNOSIS — M1711 Unilateral primary osteoarthritis, right knee: Secondary | ICD-10-CM | POA: Diagnosis not present

## 2021-05-20 DIAGNOSIS — Z8249 Family history of ischemic heart disease and other diseases of the circulatory system: Secondary | ICD-10-CM | POA: Diagnosis not present

## 2021-05-20 DIAGNOSIS — K5150 Left sided colitis without complications: Secondary | ICD-10-CM

## 2021-05-20 DIAGNOSIS — Z96651 Presence of right artificial knee joint: Secondary | ICD-10-CM | POA: Diagnosis not present

## 2021-05-20 DIAGNOSIS — Z9889 Other specified postprocedural states: Secondary | ICD-10-CM

## 2021-05-20 DIAGNOSIS — Z96659 Presence of unspecified artificial knee joint: Secondary | ICD-10-CM

## 2021-05-20 HISTORY — PX: KNEE ARTHROPLASTY: SHX992

## 2021-05-20 LAB — ABO/RH: ABO/RH(D): AB POS

## 2021-05-20 SURGERY — ARTHROPLASTY, KNEE, TOTAL, USING IMAGELESS COMPUTER-ASSISTED NAVIGATION
Anesthesia: Spinal | Site: Knee | Laterality: Right

## 2021-05-20 MED ORDER — SENNOSIDES-DOCUSATE SODIUM 8.6-50 MG PO TABS
1.0000 | ORAL_TABLET | Freq: Two times a day (BID) | ORAL | Status: DC
  Administered 2021-05-21: 1 via ORAL
  Filled 2021-05-20 (×2): qty 1

## 2021-05-20 MED ORDER — MAGNESIUM HYDROXIDE 400 MG/5ML PO SUSP
30.0000 mL | Freq: Every day | ORAL | Status: DC
  Administered 2021-05-21: 30 mL via ORAL
  Filled 2021-05-20 (×2): qty 30

## 2021-05-20 MED ORDER — 0.9 % SODIUM CHLORIDE (POUR BTL) OPTIME
TOPICAL | Status: DC | PRN
  Administered 2021-05-20: 500 mL

## 2021-05-20 MED ORDER — PROPOFOL 500 MG/50ML IV EMUL
INTRAVENOUS | Status: DC | PRN
  Administered 2021-05-20: 75 ug/kg/min via INTRAVENOUS
  Administered 2021-05-20: 50 mg via INTRAVENOUS

## 2021-05-20 MED ORDER — ORAL CARE MOUTH RINSE: 15.0000 mL | Freq: Once | OROMUCOSAL | Status: AC

## 2021-05-20 MED ORDER — DIPHENHYDRAMINE HCL 25 MG PO CAPS: 25.0000 mg | ORAL_CAPSULE | Freq: Every evening | ORAL | Status: DC | PRN

## 2021-05-20 MED ORDER — CEFAZOLIN SODIUM-DEXTROSE 2-4 GM/100ML-% IV SOLN
2.0000 g | Freq: Four times a day (QID) | INTRAVENOUS | Status: AC
  Administered 2021-05-20 – 2021-05-21 (×2): 2 g via INTRAVENOUS
  Filled 2021-05-20 (×2): qty 100

## 2021-05-20 MED ORDER — OXYCODONE HCL 5 MG PO TABS: 5.0000 mg | ORAL_TABLET | ORAL | Status: DC | PRN

## 2021-05-20 MED ORDER — CEFAZOLIN SODIUM-DEXTROSE 2-4 GM/100ML-% IV SOLN
2.0000 g | INTRAVENOUS | Status: AC
  Administered 2021-05-20: 2 g via INTRAVENOUS

## 2021-05-20 MED ORDER — PANTOPRAZOLE SODIUM 40 MG PO TBEC
40.0000 mg | DELAYED_RELEASE_TABLET | Freq: Two times a day (BID) | ORAL | Status: DC
  Administered 2021-05-20 – 2021-05-21 (×2): 40 mg via ORAL
  Filled 2021-05-20 (×2): qty 1

## 2021-05-20 MED ORDER — ENSURE PRE-SURGERY PO LIQD
296.0000 mL | Freq: Once | ORAL | Status: AC
  Administered 2021-05-20: 296 mL via ORAL
  Filled 2021-05-20: qty 296

## 2021-05-20 MED ORDER — ACETAMINOPHEN 325 MG PO TABS: 325.0000 mg | ORAL_TABLET | Freq: Four times a day (QID) | ORAL | Status: DC | PRN

## 2021-05-20 MED ORDER — GABAPENTIN 300 MG PO CAPS: 300.0000 mg | ORAL_CAPSULE | Freq: Once | ORAL | Status: AC

## 2021-05-20 MED ORDER — PHENYLEPHRINE HCL-NACL 20-0.9 MG/250ML-% IV SOLN
INTRAVENOUS | Status: AC
  Filled 2021-05-20: qty 250

## 2021-05-20 MED ORDER — SODIUM CHLORIDE 0.9 % IV SOLN: INTRAVENOUS | Status: DC

## 2021-05-20 MED ORDER — TRANEXAMIC ACID-NACL 1000-0.7 MG/100ML-% IV SOLN
INTRAVENOUS | Status: AC
  Filled 2021-05-20: qty 100

## 2021-05-20 MED ORDER — DEXAMETHASONE SODIUM PHOSPHATE 10 MG/ML IJ SOLN
INTRAMUSCULAR | Status: AC
  Administered 2021-05-20: 8 mg via INTRAVENOUS
  Filled 2021-05-20: qty 1

## 2021-05-20 MED ORDER — SODIUM CHLORIDE (PF) 0.9 % IJ SOLN
INTRAMUSCULAR | Status: DC | PRN
  Administered 2021-05-20: 120 mL via INTRAMUSCULAR

## 2021-05-20 MED ORDER — FERROUS SULFATE 325 (65 FE) MG PO TABS
325.0000 mg | ORAL_TABLET | Freq: Two times a day (BID) | ORAL | Status: DC
  Administered 2021-05-20: 325 mg via ORAL
  Filled 2021-05-20 (×2): qty 1

## 2021-05-20 MED ORDER — HYDROMORPHONE HCL 1 MG/ML IJ SOLN
0.5000 mg | INTRAMUSCULAR | Status: DC | PRN
  Administered 2021-05-20: 0.5 mg via INTRAVENOUS
  Filled 2021-05-20: qty 1

## 2021-05-20 MED ORDER — CELECOXIB 200 MG PO CAPS: 400.0000 mg | ORAL_CAPSULE | Freq: Once | ORAL | Status: AC

## 2021-05-20 MED ORDER — FLEET ENEMA 7-19 GM/118ML RE ENEM: 1.0000 | ENEMA | Freq: Once | RECTAL | Status: DC | PRN

## 2021-05-20 MED ORDER — VITAMIN D 25 MCG (1000 UNIT) PO TABS
5000.0000 [IU] | ORAL_TABLET | Freq: Every day | ORAL | Status: DC
  Administered 2021-05-21: 5000 [IU] via ORAL
  Filled 2021-05-20: qty 5

## 2021-05-20 MED ORDER — ACETAMINOPHEN 500 MG PO TABS
1000.0000 mg | ORAL_TABLET | Freq: Four times a day (QID) | ORAL | Status: DC
  Administered 2021-05-21 (×2): 1000 mg via ORAL
  Filled 2021-05-20 (×3): qty 2

## 2021-05-20 MED ORDER — TRAMADOL HCL 50 MG PO TABS
50.0000 mg | ORAL_TABLET | ORAL | Status: DC | PRN
Start: 2021-05-20 — End: 2021-05-21
  Administered 2021-05-20: 100 mg via ORAL
  Filled 2021-05-20: qty 2

## 2021-05-20 MED ORDER — PROPOFOL 10 MG/ML IV BOLUS
INTRAVENOUS | Status: DC | PRN
  Administered 2021-05-20 (×2): 10 mg via INTRAVENOUS
  Administered 2021-05-20: 20 mg via INTRAVENOUS

## 2021-05-20 MED ORDER — PHENOL 1.4 % MT LIQD
1.0000 | OROMUCOSAL | Status: DC | PRN
  Filled 2021-05-20: qty 177

## 2021-05-20 MED ORDER — TRANEXAMIC ACID-NACL 1000-0.7 MG/100ML-% IV SOLN
1000.0000 mg | Freq: Once | INTRAVENOUS | Status: AC
  Administered 2021-05-20: 1000 mg via INTRAVENOUS

## 2021-05-20 MED ORDER — ONDANSETRON HCL 4 MG PO TABS: 4.0000 mg | ORAL_TABLET | Freq: Four times a day (QID) | ORAL | Status: DC | PRN

## 2021-05-20 MED ORDER — MIDAZOLAM HCL 5 MG/5ML IJ SOLN
INTRAMUSCULAR | Status: DC | PRN
  Administered 2021-05-20 (×2): 1 mg via INTRAVENOUS

## 2021-05-20 MED ORDER — CELECOXIB 200 MG PO CAPS
ORAL_CAPSULE | ORAL | Status: AC
  Administered 2021-05-20: 400 mg via ORAL
  Filled 2021-05-20: qty 2

## 2021-05-20 MED ORDER — CEFAZOLIN SODIUM-DEXTROSE 2-4 GM/100ML-% IV SOLN
INTRAVENOUS | Status: AC
  Filled 2021-05-20: qty 100

## 2021-05-20 MED ORDER — LIDOCAINE HCL (CARDIAC) PF 100 MG/5ML IV SOSY
PREFILLED_SYRINGE | INTRAVENOUS | Status: DC | PRN
  Administered 2021-05-20: 60 mg via INTRAVENOUS

## 2021-05-20 MED ORDER — LORATADINE 10 MG PO TABS
10.0000 mg | ORAL_TABLET | Freq: Every day | ORAL | Status: DC
  Administered 2021-05-21: 10 mg via ORAL
  Filled 2021-05-20: qty 1

## 2021-05-20 MED ORDER — OXYCODONE HCL 5 MG/5ML PO SOLN: 5.0000 mg | Freq: Once | ORAL | Status: DC | PRN

## 2021-05-20 MED ORDER — METOCLOPRAMIDE HCL 10 MG PO TABS
10.0000 mg | ORAL_TABLET | Freq: Three times a day (TID) | ORAL | Status: DC
  Administered 2021-05-20 – 2021-05-21 (×2): 10 mg via ORAL
  Filled 2021-05-20 (×2): qty 1

## 2021-05-20 MED ORDER — DEXAMETHASONE SODIUM PHOSPHATE 10 MG/ML IJ SOLN: 8.0000 mg | Freq: Once | INTRAMUSCULAR | Status: AC

## 2021-05-20 MED ORDER — RISAQUAD PO CAPS
1.0000 | ORAL_CAPSULE | Freq: Every day | ORAL | Status: DC
  Administered 2021-05-21: 1 via ORAL
  Filled 2021-05-20: qty 1

## 2021-05-20 MED ORDER — CHLORHEXIDINE GLUCONATE 4 % EX LIQD
60.0000 mL | Freq: Once | CUTANEOUS | Status: AC
  Administered 2021-05-20: 4 via TOPICAL

## 2021-05-20 MED ORDER — CHLORHEXIDINE GLUCONATE 0.12 % MT SOLN: 15.0000 mL | Freq: Once | OROMUCOSAL | Status: AC

## 2021-05-20 MED ORDER — BUPIVACAINE HCL (PF) 0.5 % IJ SOLN
INTRAMUSCULAR | Status: AC
  Filled 2021-05-20: qty 10

## 2021-05-20 MED ORDER — ASCORBIC ACID 500 MG PO TABS
500.0000 mg | ORAL_TABLET | Freq: Every day | ORAL | Status: DC
  Administered 2021-05-21: 500 mg via ORAL
  Filled 2021-05-20: qty 1

## 2021-05-20 MED ORDER — FENTANYL CITRATE (PF) 100 MCG/2ML IJ SOLN: 25.0000 ug | INTRAMUSCULAR | Status: DC | PRN

## 2021-05-20 MED ORDER — GABAPENTIN 300 MG PO CAPS
ORAL_CAPSULE | ORAL | Status: AC
  Administered 2021-05-20: 300 mg via ORAL
  Filled 2021-05-20: qty 1

## 2021-05-20 MED ORDER — RESVERATROL 250 MG PO CAPS: ORAL_CAPSULE | Freq: Every day | ORAL | Status: DC

## 2021-05-20 MED ORDER — DIPHENHYDRAMINE HCL 12.5 MG/5ML PO ELIX: 12.5000 mg | ORAL_SOLUTION | ORAL | Status: DC | PRN

## 2021-05-20 MED ORDER — ALUM & MAG HYDROXIDE-SIMETH 200-200-20 MG/5ML PO SUSP: 30.0000 mL | ORAL | Status: DC | PRN

## 2021-05-20 MED ORDER — ESTRADIOL 0.1 MG/GM VA CREA
1.0000 | TOPICAL_CREAM | Freq: Every day | VAGINAL | Status: DC | PRN
Start: 2021-05-20 — End: 2021-05-21
  Filled 2021-05-20: qty 42.5

## 2021-05-20 MED ORDER — OMEGA-3-ACID ETHYL ESTERS 1 G PO CAPS
1.0000 g | ORAL_CAPSULE | Freq: Every day | ORAL | Status: DC
  Administered 2021-05-21: 1 g via ORAL
  Filled 2021-05-20: qty 1

## 2021-05-20 MED ORDER — ONDANSETRON HCL 4 MG/2ML IJ SOLN
INTRAMUSCULAR | Status: AC
  Filled 2021-05-20: qty 2

## 2021-05-20 MED ORDER — ACETAMINOPHEN 10 MG/ML IV SOLN: 1000.0000 mg | Freq: Once | INTRAVENOUS | Status: DC | PRN

## 2021-05-20 MED ORDER — OXYCODONE HCL 5 MG PO TABS
10.0000 mg | ORAL_TABLET | ORAL | Status: DC | PRN
  Administered 2021-05-20 – 2021-05-21 (×2): 10 mg via ORAL
  Filled 2021-05-20 (×2): qty 2

## 2021-05-20 MED ORDER — FENTANYL CITRATE (PF) 100 MCG/2ML IJ SOLN
INTRAMUSCULAR | Status: DC | PRN
  Administered 2021-05-20 (×2): 25 ug via INTRAVENOUS
  Administered 2021-05-20: 50 ug via INTRAVENOUS

## 2021-05-20 MED ORDER — LACTATED RINGERS IV SOLN: INTRAVENOUS | Status: DC

## 2021-05-20 MED ORDER — TRANEXAMIC ACID-NACL 1000-0.7 MG/100ML-% IV SOLN
1000.0000 mg | INTRAVENOUS | Status: AC
  Administered 2021-05-20: 1000 mg via INTRAVENOUS

## 2021-05-20 MED ORDER — CHLORHEXIDINE GLUCONATE 0.12 % MT SOLN
OROMUCOSAL | Status: AC
  Administered 2021-05-20: 15 mL via OROMUCOSAL
  Filled 2021-05-20: qty 15

## 2021-05-20 MED ORDER — ENOXAPARIN SODIUM 30 MG/0.3ML IJ SOSY
30.0000 mg | PREFILLED_SYRINGE | Freq: Two times a day (BID) | INTRAMUSCULAR | Status: DC
  Administered 2021-05-21: 30 mg via SUBCUTANEOUS
  Filled 2021-05-20: qty 0.3

## 2021-05-20 MED ORDER — MENTHOL 3 MG MT LOZG
1.0000 | LOZENGE | OROMUCOSAL | Status: DC | PRN
  Filled 2021-05-20: qty 9

## 2021-05-20 MED ORDER — PRONTOSAN WOUND IRRIGATION OPTIME
TOPICAL | Status: DC | PRN
  Administered 2021-05-20: 1 via TOPICAL

## 2021-05-20 MED ORDER — MIDAZOLAM HCL 2 MG/2ML IJ SOLN
INTRAMUSCULAR | Status: AC
  Filled 2021-05-20: qty 2

## 2021-05-20 MED ORDER — SODIUM CHLORIDE 0.9 % IR SOLN
Status: DC | PRN
  Administered 2021-05-20: 3000 mL

## 2021-05-20 MED ORDER — ACETAMINOPHEN 10 MG/ML IV SOLN
INTRAVENOUS | Status: AC
  Filled 2021-05-20: qty 100

## 2021-05-20 MED ORDER — OXYCODONE HCL 5 MG PO TABS: 5.0000 mg | ORAL_TABLET | Freq: Once | ORAL | Status: DC | PRN

## 2021-05-20 MED ORDER — PROPOFOL 1000 MG/100ML IV EMUL
INTRAVENOUS | Status: AC
  Filled 2021-05-20: qty 100

## 2021-05-20 MED ORDER — ACETAMINOPHEN 10 MG/ML IV SOLN
1000.0000 mg | Freq: Four times a day (QID) | INTRAVENOUS | Status: DC
Start: 2021-05-20 — End: 2021-05-20

## 2021-05-20 MED ORDER — ONDANSETRON HCL 4 MG/2ML IJ SOLN: 4.0000 mg | Freq: Four times a day (QID) | INTRAMUSCULAR | Status: DC | PRN

## 2021-05-20 MED ORDER — FENTANYL CITRATE (PF) 100 MCG/2ML IJ SOLN
INTRAMUSCULAR | Status: AC
  Filled 2021-05-20: qty 2

## 2021-05-20 MED ORDER — BISACODYL 10 MG RE SUPP: 10.0000 mg | Freq: Every day | RECTAL | Status: DC | PRN

## 2021-05-20 MED ORDER — CALCIUM CARBONATE ANTACID 500 MG PO CHEW
1.0000 | CHEWABLE_TABLET | Freq: Two times a day (BID) | ORAL | Status: DC
  Administered 2021-05-20 – 2021-05-21 (×2): 200 mg via ORAL
  Filled 2021-05-20 (×2): qty 1

## 2021-05-20 MED ORDER — BUPIVACAINE HCL (PF) 0.5 % IJ SOLN
INTRAMUSCULAR | Status: DC | PRN
  Administered 2021-05-20: 3 mL

## 2021-05-20 MED ORDER — PROMETHAZINE HCL 25 MG/ML IJ SOLN: 6.2500 mg | INTRAMUSCULAR | Status: DC | PRN

## 2021-05-20 MED ORDER — KETAMINE HCL 10 MG/ML IJ SOLN
INTRAMUSCULAR | Status: DC | PRN
  Administered 2021-05-20: 50 mg via INTRAVENOUS

## 2021-05-20 MED ORDER — ONDANSETRON HCL 4 MG/2ML IJ SOLN
INTRAMUSCULAR | Status: DC | PRN
  Administered 2021-05-20: 4 mg via INTRAVENOUS

## 2021-05-20 MED ORDER — KETAMINE HCL 50 MG/5ML IJ SOSY
PREFILLED_SYRINGE | INTRAMUSCULAR | Status: AC
  Filled 2021-05-20: qty 5

## 2021-05-20 MED ORDER — ACETAMINOPHEN 10 MG/ML IV SOLN
INTRAVENOUS | Status: DC | PRN
  Administered 2021-05-20: 1000 mg via INTRAVENOUS

## 2021-05-20 SURGICAL SUPPLY — 76 items
ATTUNE PSFEM RTSZ6 NARCEM KNEE (Femur) ×2 IMPLANT
ATTUNE PSRP INSR SZ6 5 KNEE (Insert) ×1 IMPLANT
ATTUNE PSRP INSR SZ6 5MM KNEE (Insert) ×1 IMPLANT
BASEPLATE TIBIAL ROTATING SZ 4 (Knees) ×2 IMPLANT
BATTERY INSTRU NAVIGATION (MISCELLANEOUS) ×12 IMPLANT
BLADE SAW 70X12.5 (BLADE) ×3 IMPLANT
BLADE SAW 90X13X1.19 OSCILLAT (BLADE) ×3 IMPLANT
BLADE SAW 90X25X1.19 OSCILLAT (BLADE) ×3 IMPLANT
CATH FOLEY SIL 2WAY 14FR5CC (CATHETERS) ×2 IMPLANT
CEMENT HV SMART SET (Cement) ×4 IMPLANT
COOLER POLAR GLACIER W/PUMP (MISCELLANEOUS) ×3 IMPLANT
CUFF TOURN SGL QUICK 24 (TOURNIQUET CUFF)
CUFF TOURN SGL QUICK 34 (TOURNIQUET CUFF)
CUFF TRNQT CYL 24X4X16.5-23 (TOURNIQUET CUFF) IMPLANT
CUFF TRNQT CYL 34X4.125X (TOURNIQUET CUFF) IMPLANT
DRAPE 3/4 80X56 (DRAPES) ×3 IMPLANT
DRAPE INCISE IOBAN 66X45 STRL (DRAPES) ×3 IMPLANT
DRSG DERMACEA 8X12 NADH (GAUZE/BANDAGES/DRESSINGS) ×3 IMPLANT
DRSG MEPILEX SACRM 8.7X9.8 (GAUZE/BANDAGES/DRESSINGS) ×3 IMPLANT
DRSG OPSITE POSTOP 4X14 (GAUZE/BANDAGES/DRESSINGS) ×3 IMPLANT
DRSG TEGADERM 4X4.75 (GAUZE/BANDAGES/DRESSINGS) ×3 IMPLANT
DURAPREP 26ML APPLICATOR (WOUND CARE) ×6 IMPLANT
ELECT CAUTERY BLADE 6.4 (BLADE) ×3 IMPLANT
ELECT REM PT RETURN 9FT ADLT (ELECTROSURGICAL) ×3
ELECTRODE REM PT RTRN 9FT ADLT (ELECTROSURGICAL) ×1 IMPLANT
EX-PIN ORTHOLOCK NAV 4X150 (PIN) ×6 IMPLANT
GLOVE SRG 8 PF TXTR STRL LF DI (GLOVE) ×1 IMPLANT
GLOVE SURG ENC TEXT LTX SZ7.5 (GLOVE) ×6 IMPLANT
GLOVE SURG UNDER POLY LF SZ7.5 (GLOVE) ×3 IMPLANT
GLOVE SURG UNDER POLY LF SZ8 (GLOVE) ×2
GOWN STRL REUS W/ TWL LRG LVL3 (GOWN DISPOSABLE) ×2 IMPLANT
GOWN STRL REUS W/ TWL XL LVL3 (GOWN DISPOSABLE) ×1 IMPLANT
GOWN STRL REUS W/TWL LRG LVL3 (GOWN DISPOSABLE) ×4
GOWN STRL REUS W/TWL XL LVL3 (GOWN DISPOSABLE) ×2
HEMOVAC 400CC 10FR (MISCELLANEOUS) ×3 IMPLANT
HOLDER FOLEY CATH W/STRAP (MISCELLANEOUS) ×3 IMPLANT
HOLSTER ELECTROSUGICAL PENCIL (MISCELLANEOUS) ×3 IMPLANT
IV NS IRRIG 3000ML ARTHROMATIC (IV SOLUTION) ×3 IMPLANT
KIT TURNOVER KIT A (KITS) ×3 IMPLANT
KNIFE SCULPS 14X20 (INSTRUMENTS) ×3 IMPLANT
LABEL OR SOLS (LABEL) ×3 IMPLANT
MANIFOLD NEPTUNE II (INSTRUMENTS) ×6 IMPLANT
NDL SAFETY ECLIPSE 18X1.5 (NEEDLE) ×1 IMPLANT
NDL SPNL 20GX3.5 QUINCKE YW (NEEDLE) ×2 IMPLANT
NEEDLE HYPO 18GX1.5 SHARP (NEEDLE) ×2
NEEDLE SPNL 20GX3.5 QUINCKE YW (NEEDLE) ×6 IMPLANT
NS IRRIG 500ML POUR BTL (IV SOLUTION) ×3 IMPLANT
PACK TOTAL KNEE (MISCELLANEOUS) ×3 IMPLANT
PAD ABD DERMACEA PRESS 5X9 (GAUZE/BANDAGES/DRESSINGS) ×6 IMPLANT
PAD WRAPON POLAR KNEE (MISCELLANEOUS) ×1 IMPLANT
PATELLA MEDIAL ATTUN 35MM KNEE (Knees) ×2 IMPLANT
PENCIL SMOKE EVACUATOR COATED (MISCELLANEOUS) ×3 IMPLANT
PIN DRILL FIX HALF THREAD (BIT) ×6 IMPLANT
PIN FIXATION 1/8DIA X 3INL (PIN) ×3 IMPLANT
PULSAVAC PLUS IRRIG FAN TIP (DISPOSABLE) ×3
SOL PREP PVP 2OZ (MISCELLANEOUS) ×3
SOLUTION PREP PVP 2OZ (MISCELLANEOUS) ×1 IMPLANT
SOLUTION PRONTOSAN WOUND 350ML (IRRIGATION / IRRIGATOR) ×3 IMPLANT
SPONGE DRAIN TRACH 4X4 STRL 2S (GAUZE/BANDAGES/DRESSINGS) ×3 IMPLANT
SPONGE T-LAP 18X18 ~~LOC~~+RFID (SPONGE) ×9 IMPLANT
STAPLER SKIN PROX 35W (STAPLE) ×3 IMPLANT
STOCKINETTE IMPERV 14X48 (MISCELLANEOUS) ×2 IMPLANT
STRAP TIBIA SHORT (MISCELLANEOUS) ×3 IMPLANT
SUCTION FRAZIER HANDLE 10FR (MISCELLANEOUS) ×2
SUCTION TUBE FRAZIER 10FR DISP (MISCELLANEOUS) ×1 IMPLANT
SUT VIC AB 0 CT1 36 (SUTURE) ×4 IMPLANT
SUT VIC AB 1 CT1 36 (SUTURE) ×6 IMPLANT
SUT VIC AB 2-0 CT2 27 (SUTURE) ×3 IMPLANT
SYR 20ML LL LF (SYRINGE) ×3 IMPLANT
SYR 30ML LL (SYRINGE) ×6 IMPLANT
TIP FAN IRRIG PULSAVAC PLUS (DISPOSABLE) ×1 IMPLANT
TOWEL OR 17X26 4PK STRL BLUE (TOWEL DISPOSABLE) ×3 IMPLANT
TOWER CARTRIDGE SMART MIX (DISPOSABLE) ×3 IMPLANT
TRAY FOLEY MTR SLVR 16FR STAT (SET/KITS/TRAYS/PACK) ×1 IMPLANT
WATER STERILE IRR 1000ML POUR (IV SOLUTION) ×2 IMPLANT
WRAPON POLAR PAD KNEE (MISCELLANEOUS) ×3

## 2021-05-20 NOTE — Progress Notes (Signed)
ORTHOPAEDICS PROGRESS NOTE  PATIENT NAME: Alison Kramer DOB: [birth year]  MRN: [id number]  Subjective: The patient is seen laying in the bed in no apparent discomfort. She states that the spinal anesthesia has resolved and she has good motor and sensory function of the legs.  She denies any nausea or vomiting. Note from physical therapy was reviewed.  Objective: Vital signs in last 24 hours: Temp:  [97.5 F (36.4 C)-97.7 F (36.5 C)] 97.7 F (36.5 C) (12/16 1937) Pulse Rate:  [65-86] 65 (12/16 1937) Resp:  [15-18] 18 (12/16 1937) BP: (111-133)/(68-83) 127/70 (12/16 1937) SpO2:  [95 %-100 %] 100 % (12/16 1937) Weight:  [86.2 kg] 86.2 kg (12/16 1017)  EXAM General: Well-developed well-nourished female seen in no apparent discomfort. Right lower extremity: Dressing is dry and intact.  Polar Care and Hemovac are intact and functioning.  Homans test is negative. Neurologic: Awake, alert, oriented.  Sensory and motor function are intact.  Assessment: Right total knee arthroplasty  Secondary diagnoses: ADHD Anxiety Ulcerative colitis Depression Gastroesophageal reflux disease  Plan: Begin physical therapy and Occupational Therapy as per total knee arthroplasty rehab protocol. Goals were reviewed with the patient.  Plan is to go Home after hospital stay. DVT Prophylaxis - Lovenox, Foot Pumps, and TED hose  Jeraldin Fesler P. Holley Bouche M.D.

## 2021-05-20 NOTE — Transfer of Care (Signed)
Immediate Anesthesia Transfer of Care Note  Patient: Alison Kramer  Procedure(s) Performed: COMPUTER ASSISTED TOTAL KNEE ARTHROPLASTY - RNFA (Right: Knee)  Patient Location: PACU  Anesthesia Type:General and Spinal  Level of Consciousness: awake, alert  and oriented  Airway & Oxygen Therapy: Patient Spontanous Breathing and Patient connected to nasal cannula oxygen  Post-op Assessment: Report given to RN and Post -op Vital signs reviewed and stable  Post vital signs: Reviewed and stable  Last Vitals:  Vitals Value Taken Time  BP 131/83 05/20/21 1515  Temp 36.4 C 05/20/21 1515  Pulse 82 05/20/21 1526  Resp 15 05/20/21 1515  SpO2 96 % 05/20/21 1526    Last Pain:  Vitals:   05/20/21 1515  TempSrc:   PainSc: 0-No pain         Complications: No notable events documented.

## 2021-05-20 NOTE — H&P (Signed)
The patient has been re-examined, and the chart reviewed, and there have been no interval changes to the documented history and physical.    The risks, benefits, and alternatives have been discussed at length. The patient expressed understanding of the risks benefits and agreed with plans for surgical intervention.  Dyanne Yorks P. Hadasa Gasner, Jr. M.D.    

## 2021-05-20 NOTE — Op Note (Signed)
OPERATIVE NOTE  DATE OF SURGERY:  05/20/2021  PATIENT NAME:  Alison Kramer   DOB: 02/24/1956  MRN: [id number]  PRE-OPERATIVE DIAGNOSIS: Degenerative arthrosis of the right knee, primary  POST-OPERATIVE DIAGNOSIS:  Same  PROCEDURE:  Right total knee arthroplasty using computer-assisted navigation  SURGEON:  Marciano Sequin. M.D.  ANESTHESIA: spinal  ESTIMATED BLOOD LOSS: 50 mL  FLUIDS REPLACED: 1300 mL of crystalloid  TOURNIQUET TIME: 89 minutes  DRAINS: 2 medium Hemovac drains  SOFT TISSUE RELEASES: Anterior cruciate ligament, posterior cruciate ligament, deep medial collateral ligament, patellofemoral ligament  IMPLANTS UTILIZED: DePuy Attune size 6N posterior stabilized femoral component (cemented), size 4 rotating platform tibial component (cemented), 35 mm medialized dome patella (cemented), and a 5 mm stabilized rotating platform polyethylene insert.  INDICATIONS FOR SURGERY: Alison Kramer is a 65 y.o. year old female with a long history of progressive knee pain. X-rays demonstrated severe degenerative changes in tricompartmental fashion. The patient had not seen any significant improvement despite conservative nonsurgical intervention. After discussion of the risks and benefits of surgical intervention, the patient expressed understanding of the risks benefits and agree with plans for total knee arthroplasty.   The risks, benefits, and alternatives were discussed at length including but not limited to the risks of infection, bleeding, nerve injury, stiffness, blood clots, the need for revision surgery, cardiopulmonary complications, among others, and they were willing to proceed.  PROCEDURE IN DETAIL: The patient was brought into the operating room and, after adequate spinal anesthesia was achieved, a tourniquet was placed on the patient's upper thigh. The patient's knee and leg were cleaned and prepped with alcohol and DuraPrep and draped in the usual sterile fashion. A  "timeout" was performed as per usual protocol. The lower extremity was exsanguinated using an Esmarch, and the tourniquet was inflated to 300 mmHg. An anterior longitudinal incision was made followed by a standard mid vastus approach. The deep fibers of the medial collateral ligament were elevated in a subperiosteal fashion off of the medial flare of the tibia so as to maintain a continuous soft tissue sleeve. The patella was subluxed laterally and the patellofemoral ligament was incised. Inspection of the knee demonstrated severe degenerative changes with full-thickness loss of articular cartilage. Osteophytes were debrided using a rongeur. Anterior and posterior cruciate ligaments were excised. Two 4.0 mm Schanz pins were inserted in the femur and into the tibia for attachment of the array of trackers used for computer-assisted navigation. Hip center was identified using a circumduction technique. Distal landmarks were mapped using the computer. The distal femur and proximal tibia were mapped using the computer. The distal femoral cutting guide was positioned using computer-assisted navigation so as to achieve a 5 distal valgus cut. The femur was sized and it was felt that a size 6N femoral component was appropriate. A size 6 femoral cutting guide was positioned and the anterior cut was performed and verified using the computer. This was followed by completion of the posterior and chamfer cuts. Femoral cutting guide for the central box was then positioned in the center box cut was performed.  Attention was then directed to the proximal tibia. Medial and lateral menisci were excised. The extramedullary tibial cutting guide was positioned using computer-assisted navigation so as to achieve a 0 varus-valgus alignment and 3 posterior slope. The cut was performed and verified using the computer. The proximal tibia was sized and it was felt that a size 4 tibial tray was appropriate. Tibial and femoral trials were  inserted followed  by insertion of a 5 mm polyethylene insert. This allowed for excellent mediolateral soft tissue balancing both in flexion and in full extension. Finally, the patella was cut and prepared so as to accommodate a 35 mm medialized dome patella. A patella trial was placed and the knee was placed through a range of motion with excellent patellar tracking appreciated. The femoral trial was removed after debridement of posterior osteophytes. The central post-hole for the tibial component was reamed followed by insertion of a keel punch. Tibial trials were then removed. Cut surfaces of bone were irrigated with copious amounts of normal saline using pulsatile lavage and then suctioned dry. Polymethylmethacrylate cement was prepared in the usual fashion using a vacuum mixer. Cement was applied to the cut surface of the proximal tibia as well as along the undersurface of a size 4 rotating platform tibial component. Tibial component was positioned and impacted into place. Excess cement was removed using [employer] fast streamer. Cement was then applied to the cut surfaces of the femur as well as along the posterior flanges of the size 6N femoral component. The femoral component was positioned and impacted into place. Excess cement was removed using [employer] fast streamer. A 5 mm polyethylene trial was inserted and the knee was brought into full extension with steady axial compression applied. Finally, cement was applied to the backside of a 35 mm medialized dome patella and the patellar component was positioned and patellar clamp applied. Excess cement was removed using [employer] fast streamer. After adequate curing of the cement, the tourniquet was deflated after a total tourniquet time of 89 minutes. Hemostasis was achieved using electrocautery. The knee was irrigated with copious amounts of normal saline using pulsatile lavage followed by 350 ml of Prontosan and then suctioned dry. 20 mL of 1.3% Exparel and 60 mL of 0.25% Marcaine  in 40 mL of normal saline was injected along the posterior capsule, medial and lateral gutters, and along the arthrotomy site. A 5 mm stabilized rotating platform polyethylene insert was inserted and the knee was placed through a range of motion with excellent mediolateral soft tissue balancing appreciated and excellent patellar tracking noted. 2 medium drains were placed in the wound bed and brought out through separate stab incisions. The medial parapatellar portion of the incision was reapproximated using interrupted sutures of #1 Vicryl. Subcutaneous tissue was approximated in layers using first #0 Vicryl followed #2-0 Vicryl. The skin was approximated with skin staples. A sterile dressing was applied.  The patient tolerated the procedure well and was transported to the recovery room in stable condition.    Tamyra Fojtik P. Holley Bouche., M.D.

## 2021-05-20 NOTE — Progress Notes (Signed)
°  Subjective: Day of Surgery Procedure(s) (LRB): COMPUTER ASSISTED TOTAL KNEE ARTHROPLASTY - RNFA (Right) Patient reports no pain at this time. Reports still feels numb in the legs from anesthesia Patient is well, and has had no acute complaints or problems Plan is to go Home after hospital stay. Negative for chest pain and shortness of breath   Objective: Vital signs in last 24 hours: Temp:  [97.5 F (36.4 C)-97.7 F (36.5 C)] 97.7 F (36.5 C) (12/16 1648) Pulse Rate:  [67-86] 67 (12/16 1648) Resp:  [15-16] 16 (12/16 1648) BP: (111-133)/(68-83) 117/71 (12/16 1648) SpO2:  [95 %-100 %] 98 % (12/16 1648) Weight:  [86.2 kg] 86.2 kg (12/16 1017)  Intake/Output from previous day:  Intake/Output Summary (Last 24 hours) at 05/20/2021 1719 Last data filed at 05/20/2021 1631 Gross per 24 hour  Intake 1840 ml  Output 620 ml  Net 1220 ml    Intake/Output this shift: Total I/O In: 1840 [P.O.:240; I.V.:1400; IV Piggyback:200] Out: 620 [Urine:500; Drains:70; Blood:50]  Labs: No results for input(s): HGB in the last 72 hours. No results for input(s): WBC, RBC, HCT, PLT in the last 72 hours. No results for input(s): NA, K, CL, CO2, BUN, CREATININE, GLUCOSE, CALCIUM in the last 72 hours. No results for input(s): LABPT, INR in the last 72 hours.   EXAM General - Patient is Alert, Appropriate, and Oriented Extremity - Compartment soft  Sensation decreased over the saphneous/lateral sural cutaneous, absent over superficial and deep distributions Dressing/Incision -Postoperative dressing remains in place., Polar Care in place and working. , Hemovac in place.  Motor Function - able to weakly planterflex/dorsiflex ankle, minimal movement of the toes Cardiovascular- Regular rate and rhythm, no murmurs/rubs/gallops Respiratory- Lungs clear to auscultation bilaterally    Assessment/Plan: Day of Surgery Procedure(s) (LRB): COMPUTER ASSISTED TOTAL KNEE ARTHROPLASTY - RNFA  (Right) Principal Problem:   Total knee replacement status  Estimated body mass index is 32.61 kg/m as calculated from the following:   Height as of this encounter: 5' 4"  (1.626 m).   Weight as of this encounter: 86.2 kg. Advance diet Up with therapy   RN was in process of applying Bone Foam, setting up incentive spirometer.   DVT Prophylaxis - Lovenox (to begin tomorrow AM), Ted hose, and foot pumps Weight-Bearing as tolerated to right leg  Cassell Smiles, PA-C [facility] 05/20/2021, 5:19 PM

## 2021-05-20 NOTE — Anesthesia Preprocedure Evaluation (Addendum)
Anesthesia Evaluation  Patient identified by MRN, date of birth, ID band Patient awake    Reviewed: Allergy & Precautions, NPO status , Patient's Chart, lab work & pertinent test results  Airway Mallampati: II  TM Distance: >3 FB Neck ROM: Full    Dental no notable dental hx.    Pulmonary neg pulmonary ROS,    Pulmonary exam normal breath sounds clear to auscultation       Cardiovascular negative cardio ROS Normal cardiovascular exam Rhythm:Regular Rate:Normal     Neuro/Psych PSYCHIATRIC DISORDERS (ADHD) Anxiety Depression negative neurological ROS     GI/Hepatic Neg liver ROS, GERD  Controlled and Medicated,Colitis, ulcerative   Endo/Other  negative endocrine ROS  Renal/GU negative Renal ROS  negative genitourinary   Musculoskeletal  (+) Arthritis ,   Abdominal (+) + obese,   Peds negative pediatric ROS (+)  Hematology negative hematology ROS (+)   Anesthesia Other Findings   Reproductive/Obstetrics negative OB ROS                           Anesthesia Physical Anesthesia Plan  ASA: 2  Anesthesia Plan: General/Spinal   Post-op Pain Management:    Induction: Intravenous  PONV Risk Score and Plan: 3 and Propofol infusion and Treatment may vary due to age or medical condition  Airway Management Planned: Nasal Cannula and Natural Airway  Additional Equipment:   Intra-op Plan:   Post-operative Plan:   Informed Consent: I have reviewed the patients History and Physical, chart, labs and discussed the procedure including the risks, benefits and alternatives for the proposed anesthesia with the patient or authorized representative who has indicated his/her understanding and acceptance.     Dental advisory given  Plan Discussed with: CRNA and Anesthesiologist  Anesthesia Plan Comments:       Anesthesia Quick Evaluation

## 2021-05-20 NOTE — Progress Notes (Signed)
PT Cancellation Note  Patient Details Name: [name] MRN: [id number] DOB: Dec 28, 1955   Cancelled Treatment:    Reason Eval/Treat Not Completed: Patient not medically ready (Consult received and chart review.  Per chart, patient with persistent numbness post-procedure; not appropriate for PT at this time.  Will re-attempt next date.)  Mena Simonis H. Owens Shark, PT, DPT, NCS 05/20/21, 5:36 PM [phone]

## 2021-05-20 NOTE — Anesthesia Procedure Notes (Addendum)
Spinal  Patient location during procedure: OR Start time: 05/20/2021 11:22 AM End time: 05/20/2021 11:31 AM Reason for block: surgical anesthesia Staffing Performed: anesthesiologist and resident/CRNA  Anesthesiologist: Iran Ouch, MD Resident/CRNA: Debe Coder, CRNA Preanesthetic Checklist Completed: patient identified, IV checked, site marked, risks and benefits discussed, surgical consent, monitors and equipment checked, pre-op evaluation and timeout performed Spinal Block Patient position: sitting Prep: ChloraPrep and DuraPrep Patient monitoring: continuous pulse ox and blood pressure Approach: left paramedian Location: L3-4 Injection technique: single-shot Needle Needle type: Whitacre  Needle gauge: 22 G Needle length: 9 cm Assessment Sensory level: T4 Events: CSF return

## 2021-05-21 DIAGNOSIS — Z8249 Family history of ischemic heart disease and other diseases of the circulatory system: Secondary | ICD-10-CM | POA: Diagnosis not present

## 2021-05-21 DIAGNOSIS — M1711 Unilateral primary osteoarthritis, right knee: Secondary | ICD-10-CM | POA: Diagnosis not present

## 2021-05-21 MED ORDER — ONDANSETRON HCL 4 MG PO TABS: 4.0000 mg | ORAL_TABLET | Freq: Four times a day (QID) | ORAL | 0 refills | Status: DC | PRN

## 2021-05-21 MED ORDER — OXYCODONE HCL 5 MG PO TABS: 5.0000 mg | ORAL_TABLET | ORAL | 0 refills | Status: DC | PRN

## 2021-05-21 MED ORDER — ENOXAPARIN SODIUM 40 MG/0.4ML IJ SOSY: 40.0000 mg | PREFILLED_SYRINGE | INTRAMUSCULAR | 0 refills | Status: DC

## 2021-05-21 MED ORDER — TRAMADOL HCL 50 MG PO TABS
50.0000 mg | ORAL_TABLET | ORAL | 0 refills | Status: DC | PRN
Start: 2021-05-21 — End: 2022-04-06

## 2021-05-21 MED ORDER — ACETAMINOPHEN 500 MG PO TABS: 1000.0000 mg | ORAL_TABLET | Freq: Four times a day (QID) | ORAL | 0 refills | Status: DC

## 2021-05-21 NOTE — Plan of Care (Signed)
°  Problem: Education: Goal: Knowledge of the prescribed therapeutic regimen will improve Outcome: Adequate for Discharge Goal: Individualized Educational Video(s) Outcome: Adequate for Discharge   Problem: Activity: Goal: Ability to avoid complications of mobility impairment will improve Outcome: Adequate for Discharge Goal: Range of joint motion will improve Outcome: Adequate for Discharge   Problem: Clinical Measurements: Goal: Postoperative complications will be avoided or minimized Outcome: Adequate for Discharge   Problem: Pain Management: Goal: Pain level will decrease with appropriate interventions Outcome: Adequate for Discharge   Problem: Skin Integrity: Goal: Will show signs of wound healing Outcome: Adequate for Discharge

## 2021-05-21 NOTE — Progress Notes (Addendum)
°  Subjective: 1 Day Post-Op Procedure(s) (LRB): COMPUTER ASSISTED TOTAL KNEE ARTHROPLASTY - RNFA (Right) Patient reports pain as mild.   Patient is well, and has had no acute complaints or problems Plan is to go Home after hospital stay. Negative for chest pain and shortness of breath Fever: no Gastrointestinal:Negative for nausea and vomiting Patient is not passing gas yet, no BM.  History of UC  Objective: Vital signs in last 24 hours: Temp:  [97.5 F (36.4 C)-98 F (36.7 C)] 98 F (36.7 C) (12/17 0725) Pulse Rate:  [59-86] 71 (12/17 0836) Resp:  [15-18] 15 (12/17 0725) BP: (105-133)/(55-83) 105/55 (12/17 0725) SpO2:  [95 %-100 %] 99 % (12/17 0836) Weight:  [86.2 kg] 86.2 kg (12/16 1017)  Intake/Output from previous day:  Intake/Output Summary (Last 24 hours) at 05/21/2021 0852 Last data filed at 05/21/2021 0600 Gross per 24 hour  Intake 3287.09 ml  Output 1480 ml  Net 1807.09 ml    Intake/Output this shift: No intake/output data recorded.  Labs: No results for input(s): HGB in the last 72 hours. No results for input(s): WBC, RBC, HCT, PLT in the last 72 hours. No results for input(s): NA, K, CL, CO2, BUN, CREATININE, GLUCOSE, CALCIUM in the last 72 hours. No results for input(s): LABPT, INR in the last 72 hours.   EXAM General - Patient is Alert, Appropriate, and Oriented Extremity - ABD soft Neurovascular intact Sensation intact distally Dorsiflexion/Plantar flexion intact Incision: dressing C/D/I No cellulitis present Compartment soft Negative Homans to bilateral lower extremities. Dressing/Incision - Bulky dressing removed, hemovac with mild bloody drainage noted.  Hemovac removed without issue and new 4x4 with tegaderm applied.  Polar care re-applied following dressing change. Motor Function - intact, moving foot and toes well on exam.  ABdomen soft with normal bowel sounds this AM.  Past Medical History:  Diagnosis Date   ADHD (attention deficit  hyperactivity disorder)    Anxiety    Colitis, ulcerative (HCC)    Depression    GERD (gastroesophageal reflux disease)     Assessment/Plan: 1 Day Post-Op Procedure(s) (LRB): COMPUTER ASSISTED TOTAL KNEE ARTHROPLASTY - RNFA (Right) Principal Problem:   Total knee replacement status  Estimated body mass index is 32.61 kg/m as calculated from the following:   Height as of this encounter: 5' 4"  (1.626 m).   Weight as of this encounter: 86.2 kg. Advance diet Up with therapy D/C IV fluids  Vitals reviewed this AM. Patient has done extremely well with therapy, completed stair training and lap around floor. Bulky dressing removed, hemovac removed today.   Patient reports she is not passing gas yet, no BM.  Patient with history of UC, hesitant to take anything that affects her bowels.  Continue to monitor.  If she begins passing gas and does not have any abdominal pain will plan for discharge. Plan for discharge home this afternoon pending progress today.  Apply TED hose to bilateral legs prior to discharge.  DVT Prophylaxis - Lovenox, Foot Pumps, and TED hose Weight-Bearing as tolerated to right leg  J. Cameron Proud, PA-C [facility] 05/21/2021, 8:52 AM

## 2021-05-21 NOTE — Evaluation (Signed)
Physical Therapy Evaluation Patient Details Name: DAVENE JOBIN MRN: [id number] DOB: 1955-08-24 Today's Date: 05/21/2021  History of Present Illness  Cherita Hebel is a 65 y.o. female s/p R TKA on 05/20/21. PMH: OA  Clinical Impression  Patient tolerated session well and was agreeable to treatment. Prior to surgery patient was independent with ADLs. Patient's pain ranged from 4-5/10 throughout session. Patient requires supervision with RW for safety with sit to stand transfers and ambulation. Patient's R knee ROM at beginning of session was 55 degrees, following 5 active assisted heel slides patient was able to achieve 65 degrees of R knee flexion. Patient requires R HHA in order to complete stairs. Patient was educated on HEP packet and verbalized understanding to each exercise. Would benefit from HHPT following discharge from acute hospitalization in order to optimize patient's return to PLOF.      Recommendations for follow up therapy are one component of a multi-disciplinary discharge planning process, led by the attending physician.  Recommendations may be updated based on patient status, additional functional criteria and insurance authorization.  Follow Up Recommendations Home health PT    Assistance Recommended at Discharge PRN  Functional Status Assessment Patient has had a recent decline in their functional status and demonstrates the ability to make significant improvements in function in a reasonable and predictable amount of time.  Equipment Recommendations  Rolling walker (2 wheels)    Recommendations for Other Services       Precautions / Restrictions Precautions Precautions: Knee Precaution Booklet Issued: Yes (comment) Restrictions Weight Bearing Restrictions: Yes RLE Weight Bearing: Weight bearing as tolerated      Mobility  Bed Mobility Overal bed mobility: Modified Independent             General bed mobility comments: HOB elevated     Transfers Overall transfer level: Needs assistance Equipment used: Rolling walker (2 wheels) Transfers: Sit to/from Stand Sit to Stand: Supervision           General transfer comment: VCs for technique    Ambulation/Gait Ambulation/Gait assistance: Supervision Gait Distance (Feet):  (x200 from EOB>around nurses station> therapy room steps (completed steps) x40 feet from therapy room back to EOB) Assistive device: Rolling walker (2 wheels) Gait Pattern/deviations: Step-through pattern;Decreased step length - right;Decreased step length - left;Antalgic;Decreased stance time - right;Decreased stride length Gait velocity: decreased cadence        Stairs Stairs: Yes Stairs assistance: Supervision;Min guard Stair Management: Two rails;No rails   General stair comments: Patient completed x4 steps with BUE support supervsion, due to patient having no hand rail with step to enter come patient completed 1 step with R HHA; no LOB noted; demonstrated understanding of proper step sequencing  Wheelchair Mobility    Modified Rankin (Stroke Patients Only)       Balance Overall balance assessment: Needs assistance Sitting-balance support: No upper extremity supported;Feet supported Sitting balance-Leahy Scale: Good Sitting balance - Comments: can reach outside BOS   Standing balance support: Single extremity supported;During functional activity Standing balance-Leahy Scale: Good                               Pertinent Vitals/Pain Pain Assessment: 0-10 Pain Score: 4  Pain Location: R knee Pain Descriptors / Indicators: Aching;Discomfort Pain Intervention(s): Limited activity within patient's tolerance;Monitored during session;Repositioned    Home Living Family/patient expects to be discharged to:: Private residence Living Arrangements: Spouse/significant other Available Help at Discharge: Family Type of  Home: House Home Access: Stairs to enter Entrance  Stairs-Rails: None Technical brewer of Steps: 1   Home Layout: One level Home Equipment: Shower seat - built in      Prior Function Prior Level of Function : Independent/Modified Independent                     Hand Dominance   Dominant Hand: Right    Extremity/Trunk Assessment   Upper Extremity Assessment Upper Extremity Assessment: Defer to OT evaluation    Lower Extremity Assessment Lower Extremity Assessment: Overall WFL for tasks assessed;Generalized weakness;RLE deficits/detail RLE Deficits / Details: Generalized weakness on the R leg due to s/p surgery, globally 3/5 strength (able to perform SLR) RLE Sensation: WNL       Communication   Communication: No difficulties  Cognition Arousal/Alertness: Awake/alert Behavior During Therapy: WFL for tasks assessed/performed Overall Cognitive Status: Within Functional Limits for tasks assessed                                          General Comments      Exercises Total Joint Exercises Heel Slides: Supine;AAROM;Right;5 reps Goniometric ROM: Prior to heel slides patients R knee flexion ROM= 55 degrees, following x5 active assisted heel slides patient was able to get 65 degress of R knee flexion ROM Other Exercises Other Exercises: Patient educated on WBAT precautions and fall prevention Other Exercises: Patient educated on proper stair sequencing Other Exercises: x3 sit to supine Mod I Other Exercises: Patient educated on HEP packet   Assessment/Plan    PT Assessment Patient needs continued PT services  PT Problem List Decreased strength;Decreased mobility;Decreased range of motion;Decreased activity tolerance;Decreased balance;Pain       PT Treatment Interventions DME instruction;Therapeutic exercise;Gait training;Therapeutic activities;Patient/family education;Neuromuscular re-education    PT Goals (Current goals can be found in the Care Plan section)  Acute Rehab PT  Goals Patient Stated Goal: wants to go home PT Goal Formulation: With patient Time For Goal Achievement: 06/04/21 Potential to Achieve Goals: Good    Frequency BID   Barriers to discharge        Co-evaluation               AM-PAC PT "6 Clicks" Mobility  Outcome Measure Help needed turning from your back to your side while in a flat bed without using bedrails?: None Help needed moving from lying on your back to sitting on the side of a flat bed without using bedrails?: None Help needed moving to and from a bed to a chair (including a wheelchair)?: A Little Help needed standing up from a chair using your arms (e.g., wheelchair or bedside chair)?: A Little Help needed to walk in hospital room?: None Help needed climbing 3-5 steps with a railing? : A Little 6 Click Score: 21    End of Session Equipment Utilized During Treatment: Gait belt Activity Tolerance: Patient tolerated treatment well Patient left: in bed;with SCD's reapplied;with bed alarm set (with PA present in room) Nurse Communication: Mobility status PT Visit Diagnosis: Other abnormalities of gait and mobility (R26.89);Muscle weakness (generalized) (M62.81)    Time: 8563-1497 PT Time Calculation (min) (ACUTE ONLY): 25 min   Charges:   PT Evaluation $PT Eval Low Complexity: 1 Low PT Treatments $Therapeutic Activity: 8-22 mins        Iva Boop, PT  05/21/21. 8:57 AM

## 2021-05-21 NOTE — TOC Progression Note (Signed)
Transition of Care [facility]) - Progression Note    Patient Details  Name: Alison Kramer MRN: [id number] Date of Birth: 1956-04-10  Transition of Care [facility]) CM/SW [place], RN Phone Number: 05/21/2021, 9:01 AM  Clinical Narrative:   met with the patient in the room to discuss DC planning and needs She lives at home with her husband, She needs a RW and a 3 in 1, I notified Jasmine with [employer] secure VM, [employer] will deliver to the room prior to DC, The patient is set up with [facility] services thru [facility], She has transportation and can afford her medication, no additional needs         Expected Discharge Plan and Services                                                 Social Determinants of Health (SDOH) Interventions    Readmission Risk Interventions No flowsheet data found.

## 2021-05-21 NOTE — Evaluation (Signed)
Occupational Therapy Evaluation Patient Details Name: Alison Kramer MRN: [id number] DOB: Jan 10, 1956 Today's Date: 05/21/2021   History of Present Illness Alison Kramer is a 64 y.o. female s/p R TKA on 05/20/21. PMH: OA   Clinical Impression   Alison Kramer was seen for OT evaluation this date, POD#1 from above surgery. Pt was independent in all ADLs prior to surgery. Pt completes 10 SLR at bed level with no deficits. Pt currently requires SUPERVISION + RW for ADL t/f and functional reaching task reaching at above head and knee height cabinets c single UE support. MOD I don/doff B socks seated EOB - increased time to perform on R. Pt instructed in polar care mgt, falls prevention strategies, pet management, home/routines modifications, DME/AE for LB bathing and dressing tasks, and compression stocking mgt. All education complete, no acute OT needs, will sign off. Do not currently anticipate any OT needs following this hospitalization.         Recommendations for follow up therapy are one component of a multi-disciplinary discharge planning process, led by the attending physician.  Recommendations may be updated based on patient status, additional functional criteria and insurance authorization.   Follow Up Recommendations  No OT follow up    Assistance Recommended at Discharge Set up Supervision/Assistance  Functional Status Assessment  Patient has had a recent decline in their functional status and demonstrates the ability to make significant improvements in function in a reasonable and predictable amount of time.  Equipment Recommendations  Other (comment) (2WW)    Recommendations for Other Services       Precautions / Restrictions Precautions Precautions: Knee Restrictions Weight Bearing Restrictions: Yes RLE Weight Bearing: Weight bearing as tolerated      Mobility Bed Mobility Overal bed mobility: Modified Independent             General bed mobility comments: HOB  elevated    Transfers Overall transfer level: Needs assistance Equipment used: Rolling walker (2 wheels) Transfers: Sit to/from Stand Sit to Stand: Supervision           General transfer comment: VCs for technique      Balance Overall balance assessment: Needs assistance Sitting-balance support: No upper extremity supported;Feet supported Sitting balance-Leahy Scale: Good     Standing balance support: Single extremity supported;During functional activity Standing balance-Leahy Scale: Good                             ADL either performed or assessed with clinical judgement   ADL Overall ADL's : Needs assistance/impaired                                       General ADL Comments: MOD I don/doff B socks seated EOB - increased time to perform on R. SUPERVISION + RW for ADL t/f and functional reaching task reaching at above head and knee height cabinets c single UE support      Pertinent Vitals/Pain Pain Assessment: 0-10 Pain Score: 4  Pain Location: R knee Pain Descriptors / Indicators: Aching;Discomfort Pain Intervention(s): Limited activity within patient's tolerance;Repositioned     Hand Dominance Right   Extremity/Trunk Assessment Upper Extremity Assessment Upper Extremity Assessment: Overall WFL for tasks assessed   Lower Extremity Assessment Lower Extremity Assessment: Overall WFL for tasks assessed       Communication Communication Communication: No difficulties   Cognition Arousal/Alertness:  Awake/alert Behavior During Therapy: WFL for tasks assessed/performed Overall Cognitive Status: Within Functional Limits for tasks assessed                                       General Comments       Exercises Exercises: Other exercises Other Exercises Other Exercises: Pt educated re: OT role, DME recs, d/c recs, falls prevention, polar care mgmt, compression stocking mgmt Other Exercises: LBD, sup>sit,  sit<>stand, functional reach, sitting/standing balance/tolerance   Shoulder Instructions      Home Living Family/patient expects to be discharged to:: Private residence Living Arrangements: Spouse/significant other Available Help at Discharge: Family Type of Home: House Home Access: Stairs to enter Technical brewer of Steps: 1   Home Layout: One level     Bathroom Shower/Tub: Occupational psychologist: Standard Bathroom Accessibility: Yes How Accessible: Accessible via walker Home Equipment: Shower seat - built in          Prior Functioning/Environment Prior Level of Function : Independent/Modified Independent                        OT Problem List: Decreased activity tolerance         OT Goals(Current goals can be found in the care plan section) Acute Rehab OT Goals Patient Stated Goal: to go home today OT Goal Formulation: With patient Time For Goal Achievement: 06/04/21 Potential to Achieve Goals: Good   AM-PAC OT "6 Clicks" Daily Activity     Outcome Measure Help from another person eating meals?: None Help from another person taking care of personal grooming?: A Little Help from another person toileting, which includes using toliet, bedpan, or urinal?: A Little Help from another person bathing (including washing, rinsing, drying)?: A Little Help from another person to put on and taking off regular upper body clothing?: None Help from another person to put on and taking off regular lower body clothing?: None 6 Click Score: 21   End of Session Equipment Utilized During Treatment: Rolling walker (2 wheels)  Activity Tolerance: Patient tolerated treatment well Patient left: Other (comment) (standing in room with PT)  OT Visit Diagnosis: Other abnormalities of gait and mobility (R26.89)                Time: 6415-8309 OT Time Calculation (min): 13 min Charges:  OT General Charges $OT Visit: 1 Visit OT Evaluation $OT Eval Low  Complexity: 1 Low  Alison Kramer, M.S. OTR/L  05/21/21, 8:23 AM  ascom [phone]

## 2021-05-21 NOTE — Discharge Summary (Signed)
Physician Discharge Summary  Patient ID: Alison Kramer MRN: [id number] DOB/AGE: January 17, 1956 65 y.o.  Admit date: 05/20/2021 Discharge date: 05/21/2021  Admission Diagnoses:  Total knee replacement status [Z96.659]  Discharge Diagnoses: Patient Active Problem List   Diagnosis Date Noted   Total knee replacement status 05/20/2021   Encounter for annual wellness visit (AWV) in Medicare patient 04/04/2021   Primary osteoarthritis of right knee 03/12/2021   Overweight 11/07/2019   S/P colonoscopic polypectomy 10/05/2017   Insomnia 10/12/2015   Allergic rhinitis 09/29/2015   ADD (attention deficit disorder) 12/10/2014   Ulcerative colitis, left sided, chronic ([place]) 11/22/2011   Acute or subacute iridocyclitis 03/01/2011   Colitis gravis ([place]) 03/01/2011    Past Medical History:  Diagnosis Date   ADHD (attention deficit hyperactivity disorder)    Anxiety    Colitis, ulcerative ([place])    Depression    GERD (gastroesophageal reflux disease)      Transfusion: None.   Consultants (if any):   Discharged Condition: Improved  Hospital Course: Alison Kramer is an 65 y.o. female who was admitted 05/20/2021 with a diagnosis of primary degenerative arthrosis of the right knee and went to the operating room on 05/20/2021 and underwent the above named procedures.    Surgeries: Procedure(s): COMPUTER ASSISTED TOTAL KNEE ARTHROPLASTY - RNFA on 05/20/2021 Patient tolerated the surgery well. Taken to PACU where she was stabilized and then transferred to the orthopedic floor.  Started on Lovenox 45m q 12 hrs. Foot pumps applied bilaterally at 80 mm. Heels elevated on bed with rolled towels. No evidence of DVT. Negative [place]. Physical therapy started on day #1 for gait training and transfer. OT started day #1 for ADL and assisted devices.  Patient's IV and hemovac were both removed on POD1.  Foley was removed in the OR following surgery.  Implants: DePuy Attune size 6N posterior  stabilized femoral component (cemented), size 4 rotating platform tibial component (cemented), 35 mm medialized dome patella (cemented), and a 5 mm stabilized rotating platform polyethylene insert.  She was given perioperative antibiotics:  Anti-infectives (From admission, onward)    Start     Dose/Rate Route Frequency Ordered Stop   05/20/21 1730  ceFAZolin (ANCEF) IVPB 2g/100 mL premix        2 g 200 mL/hr over 30 Minutes Intravenous Every 6 hours 05/20/21 1643 05/21/21 0043   05/20/21 1024  ceFAZolin (ANCEF) 2-4 GM/100ML-% IVPB       Note to Pharmacy: HHerby AbrahamW: cabinet override      05/20/21 1024 05/20/21 1151   05/20/21 0600  ceFAZolin (ANCEF) IVPB 2g/100 mL premix        2 g 200 mL/hr over 30 Minutes Intravenous On call to O.R. 05/20/21 0217 05/20/21 1150     .  She was given sequential compression devices, early ambulation, and Lovenox for DVT prophylaxis.  She benefited maximally from the hospital stay and there were no complications.    Recent vital signs:  Vitals:   05/21/21 0725 05/21/21 0836  BP: (!) 105/55   Pulse: (!) 59 71  Resp: 15   Temp: 98 F (36.7 C)   SpO2: 97% 99%    Recent laboratory studies:  Lab Results  Component Value Date   HGB 13.1 04/18/2021   HGB 13.7 11/11/2019   HGB 12.2 10/15/2017   Lab Results  Component Value Date   WBC 7.3 04/18/2021   PLT 341 04/18/2021   Lab Results  Component Value Date   INR 1.0 05/09/2021  Lab Results  Component Value Date   NA 141 04/18/2021   K 4.3 04/18/2021   CL 105 04/18/2021   CO2 23 04/18/2021   BUN 19 04/18/2021   CREATININE 1.02 (H) 04/18/2021   GLUCOSE 87 04/18/2021    Discharge Medications:   Allergies as of 05/21/2021   No Known Allergies      Medication List     TAKE these medications    acetaminophen 500 MG tablet Commonly known as: TYLENOL Take 2 tablets (1,000 mg total) by mouth every 6 (six) hours.   CALCIUM 1200 PO Take 1,200 mg by mouth in the morning and  at bedtime.   cetirizine 10 MG tablet Commonly known as: ZYRTEC Take 1 tablet (10 mg total) by mouth daily.   Cholecalciferol 25 MCG (1000 UT) tablet Take 5,000 Units by mouth daily.   Coenzyme Q10 100 MG capsule Take 100 mg by mouth daily.   diphenhydrAMINE 25 MG tablet Commonly known as: BENADRYL Take 25 mg by mouth at bedtime as needed for sleep.   enoxaparin 40 MG/0.4ML injection Commonly known as: LOVENOX Inject 0.4 mLs (40 mg total) into the skin daily.   estradiol 0.1 MG/GM vaginal cream Commonly known as: ESTRACE PLACE 1 APPLICATORFUL VAGINALLY THREE TIMES A WEEK What changed: See the new instructions.   Fish Oil 1000 MG Caps Take 1,000 mg by mouth daily.   HORNY GOAT WEED PO Take 1 tablet by mouth daily.   NON FORMULARY Apply 2 application topically daily. Soma Derm Apply daily for 5 days then off for 2 days   omeprazole 20 MG tablet Commonly known as: PRILOSEC OTC Take 20 mg by mouth daily.   ondansetron 4 MG tablet Commonly known as: ZOFRAN Take 1 tablet (4 mg total) by mouth every 6 (six) hours as needed for nausea.   OVER THE COUNTER MEDICATION Take 1 capsule by mouth daily. Acemannan   oxyCODONE 5 MG immediate release tablet Commonly known as: Oxy IR/ROXICODONE Take 1-2 tablets (5-10 mg total) by mouth every 4 (four) hours as needed for severe pain.   Phenylephrine-Acetaminophen 5-325 MG Caps Take 2 tablets by mouth 4 (four) times daily as needed.   PROBIOTIC ADVANCED PO Take 1 tablet by mouth daily.   RESVERATROL PO Take 1 tablet by mouth daily.   traMADol 50 MG tablet Commonly known as: ULTRAM Take 1 tablet (50 mg total) by mouth every 4 (four) hours as needed for moderate pain.   Turmeric 400 MG Caps Take 400 mg by mouth daily.   VITAMIN C PO Take 2 tablets by mouth daily.   ZINC ACETATE PO Take 1 tablet by mouth daily.               Durable Medical Equipment  (From admission, onward)           Start     Ordered    05/20/21 1644  DME Walker rolling  Once       Question:  Patient needs a walker to treat with the following condition  Answer:  Total knee replacement status   05/20/21 1643   05/20/21 1644  DME Bedside commode  Once       Question:  Patient needs a bedside commode to treat with the following condition  Answer:  Total knee replacement status   05/20/21 1643            Diagnostic Studies: DG Knee Right Port  Result Date: 05/20/2021 CLINICAL DATA:  Total knee arthroplasty. EXAM:  PORTABLE RIGHT KNEE - 1-2 VIEW COMPARISON:  None. FINDINGS: Sequelae of total knee arthroplasty are identified. The prosthetic components appear normally located. Surgical drains and skin staples are in place. Postoperative gas is noted in the surrounding soft tissues. No acute fracture is identified. IMPRESSION: Right total knee arthroplasty without evidence of acute complication. Electronically Signed   By: Logan Bores M.D.   On: 05/20/2021 15:43    Disposition: Plan for discharge home today pending the patient passing gas or having a BM without any abdominal pain.   Follow-up Information     Fausto Skillern, PA-C Follow up on 06/03/2021.   Specialty: Orthopedic Surgery Why: at 1:15pm Contact information: [place] [address] [phone]         Dereck Leep, MD Follow up on 06/30/2021.   Specialty: Orthopedic Surgery Why: at 2:15pm Contact information: [place] [address] [phone]                Signed: Judson Roch PA-C 05/21/2021, 9:04 AM

## 2021-05-21 NOTE — Plan of Care (Signed)

## 2021-05-23 ENCOUNTER — Encounter: Payer: Self-pay | Admitting: Orthopedic Surgery

## 2021-05-28 ENCOUNTER — Encounter: Payer: Self-pay | Admitting: Orthopedic Surgery

## 2021-05-28 NOTE — Anesthesia Postprocedure Evaluation (Signed)
Anesthesia Post Note  Patient: Alison Kramer  Procedure(s) Performed: COMPUTER ASSISTED TOTAL KNEE ARTHROPLASTY - RNFA (Right: Knee)  Patient location during evaluation: PACU Anesthesia Type: Combined General/Spinal Level of consciousness: oriented and awake and alert Pain management: pain level controlled Vital Signs Assessment: post-procedure vital signs reviewed and stable Respiratory status: spontaneous breathing, respiratory function stable and patient connected to nasal cannula oxygen Cardiovascular status: blood pressure returned to baseline and stable Postop Assessment: no headache, no backache and no apparent nausea or vomiting Anesthetic complications: no   No notable events documented.   Last Vitals:  Vitals:   05/21/21 0836 05/21/21 1211  BP:  117/62  Pulse: 71 68  Resp:  16  Temp:  36.7 C  SpO2: 99% 96%    Last Pain:  Vitals:   05/21/21 0900  TempSrc:   PainSc: 0-No pain                 [place] Ouch

## 2021-06-03 DIAGNOSIS — M25661 Stiffness of right knee, not elsewhere classified: Secondary | ICD-10-CM | POA: Diagnosis not present

## 2021-06-03 DIAGNOSIS — H43813 Vitreous degeneration, bilateral: Secondary | ICD-10-CM | POA: Diagnosis not present

## 2021-06-03 DIAGNOSIS — G8929 Other chronic pain: Secondary | ICD-10-CM | POA: Diagnosis not present

## 2021-06-03 DIAGNOSIS — M25561 Pain in right knee: Secondary | ICD-10-CM | POA: Diagnosis not present

## 2021-06-03 DIAGNOSIS — Z96651 Presence of right artificial knee joint: Secondary | ICD-10-CM | POA: Diagnosis not present

## 2021-06-03 DIAGNOSIS — M6281 Muscle weakness (generalized): Secondary | ICD-10-CM | POA: Diagnosis not present

## 2021-06-13 DIAGNOSIS — M25561 Pain in right knee: Secondary | ICD-10-CM | POA: Diagnosis not present

## 2021-06-13 DIAGNOSIS — Z96651 Presence of right artificial knee joint: Secondary | ICD-10-CM | POA: Diagnosis not present

## 2021-06-16 DIAGNOSIS — Z96651 Presence of right artificial knee joint: Secondary | ICD-10-CM | POA: Diagnosis not present

## 2021-06-16 DIAGNOSIS — M25561 Pain in right knee: Secondary | ICD-10-CM | POA: Diagnosis not present

## 2021-06-21 DIAGNOSIS — Z96651 Presence of right artificial knee joint: Secondary | ICD-10-CM | POA: Diagnosis not present

## 2021-06-23 DIAGNOSIS — M25561 Pain in right knee: Secondary | ICD-10-CM | POA: Diagnosis not present

## 2021-06-23 DIAGNOSIS — Z96651 Presence of right artificial knee joint: Secondary | ICD-10-CM | POA: Diagnosis not present

## 2021-06-28 DIAGNOSIS — Z471 Aftercare following joint replacement surgery: Secondary | ICD-10-CM | POA: Diagnosis not present

## 2021-06-30 DIAGNOSIS — Z96651 Presence of right artificial knee joint: Secondary | ICD-10-CM | POA: Diagnosis not present

## 2021-06-30 DIAGNOSIS — M25561 Pain in right knee: Secondary | ICD-10-CM | POA: Diagnosis not present

## 2021-07-05 DIAGNOSIS — Z96651 Presence of right artificial knee joint: Secondary | ICD-10-CM | POA: Diagnosis not present

## 2021-07-05 DIAGNOSIS — M25561 Pain in right knee: Secondary | ICD-10-CM | POA: Diagnosis not present

## 2021-07-08 DIAGNOSIS — Z96651 Presence of right artificial knee joint: Secondary | ICD-10-CM | POA: Diagnosis not present

## 2021-07-08 DIAGNOSIS — M25561 Pain in right knee: Secondary | ICD-10-CM | POA: Diagnosis not present

## 2021-07-11 DIAGNOSIS — M25561 Pain in right knee: Secondary | ICD-10-CM | POA: Diagnosis not present

## 2021-07-11 DIAGNOSIS — Z96651 Presence of right artificial knee joint: Secondary | ICD-10-CM | POA: Diagnosis not present

## 2021-07-15 DIAGNOSIS — M25561 Pain in right knee: Secondary | ICD-10-CM | POA: Diagnosis not present

## 2021-07-15 DIAGNOSIS — Z96651 Presence of right artificial knee joint: Secondary | ICD-10-CM | POA: Diagnosis not present

## 2021-09-30 DIAGNOSIS — H524 Presbyopia: Secondary | ICD-10-CM | POA: Diagnosis not present

## 2021-11-24 DIAGNOSIS — L570 Actinic keratosis: Secondary | ICD-10-CM | POA: Diagnosis not present

## 2021-11-24 DIAGNOSIS — D3611 Benign neoplasm of peripheral nerves and autonomic nervous system of face, head, and neck: Secondary | ICD-10-CM | POA: Diagnosis not present

## 2021-11-24 DIAGNOSIS — D485 Neoplasm of uncertain behavior of skin: Secondary | ICD-10-CM | POA: Diagnosis not present

## 2022-01-13 ENCOUNTER — Ambulatory Visit: Payer: Self-pay | Admitting: *Deleted

## 2022-01-13 NOTE — Patient Outreach (Signed)
  Care Coordination   Initial Visit Note   01/13/2022 Name: Alison Kramer MRN: [id number] DOB: November 06, 1955  Alison Kramer is a 66 y.o. year old female who sees Bacigalupo, Dionne Bucy, MD for primary care. I spoke with  Lowry Ram by phone today  What matters to the patients health and wellness today?  Patient verbalized having no medical/community resource needs at this time.    Goals Addressed             This Visit's Progress    care coordination activities       Care Coordination Interventions:   Needs assessment completed, SDOH screening completed AWV confirmed to be completed on 04/04/21 PCP visit previously scheduled for 04/06/22 Case management services introduced, patient verbalized having no additional needs at this time        SDOH assessments and interventions completed:  Yes  SDOH Interventions Today    Flowsheet Row Most Recent Value  SDOH Interventions   Food Insecurity Interventions Intervention Not Indicated  Financial Strain Interventions Intervention Not Indicated  Housing Interventions Intervention Not Indicated  Transportation Interventions Intervention Not Indicated        Care Coordination Interventions Activated:  Yes  Care Coordination Interventions:  Yes, provided   Follow up plan: No further intervention required.   Encounter Outcome:  Pt. Visit Completed

## 2022-01-13 NOTE — Patient Instructions (Signed)
Visit Information  Thank you for taking time to visit with me today. Please don't hesitate to contact me if I can be of assistance to you.   Following are the goals we discussed today:   Goals Addressed             This Visit's Progress    care coordination activities       Care Coordination Interventions:   Needs assessment completed, SDOH screening completed AWV confirmed to be completed on 04/04/21 PCP visit previously scheduled for 04/06/22 Case management services introduced, patient verbalized having no additional needs at this time         Please call the care guide team at [phone] if you need to cancel or reschedule your appointment.   If you are experiencing a Mental Health or Fults or need someone to talk to, please call the Suicide and Crisis Lifeline: 988   Patient verbalizes understanding of instructions and care plan provided today and agrees to view in [place]. Active MyChart status and patient understanding of how to access instructions and care plan via MyChart confirmed with patient.     Next PCP appointment scheduled for: 04/06/22  Elliot Gurney, [place] Worker  [facility] [phone]

## 2022-04-06 ENCOUNTER — Encounter: Payer: Self-pay | Admitting: Family Medicine

## 2022-04-06 ENCOUNTER — Ambulatory Visit (INDEPENDENT_AMBULATORY_CARE_PROVIDER_SITE_OTHER): Payer: Medicare HMO | Admitting: Family Medicine

## 2022-04-06 VITALS — BP 108/67 | HR 70 | Temp 98.3°F | Resp 16 | Ht 64.0 in | Wt 195.0 lb

## 2022-04-06 DIAGNOSIS — Z1231 Encounter for screening mammogram for malignant neoplasm of breast: Secondary | ICD-10-CM

## 2022-04-06 DIAGNOSIS — Z0000 Encounter for general adult medical examination without abnormal findings: Secondary | ICD-10-CM

## 2022-04-06 DIAGNOSIS — Z6831 Body mass index (BMI) 31.0-31.9, adult: Secondary | ICD-10-CM | POA: Insufficient documentation

## 2022-04-06 DIAGNOSIS — E669 Obesity, unspecified: Secondary | ICD-10-CM | POA: Diagnosis not present

## 2022-04-06 DIAGNOSIS — Z6833 Body mass index (BMI) 33.0-33.9, adult: Secondary | ICD-10-CM

## 2022-04-06 DIAGNOSIS — G4700 Insomnia, unspecified: Secondary | ICD-10-CM | POA: Diagnosis not present

## 2022-04-06 DIAGNOSIS — Z23 Encounter for immunization: Secondary | ICD-10-CM | POA: Diagnosis not present

## 2022-04-06 DIAGNOSIS — K5150 Left sided colitis without complications: Secondary | ICD-10-CM

## 2022-04-06 DIAGNOSIS — Z1211 Encounter for screening for malignant neoplasm of colon: Secondary | ICD-10-CM

## 2022-04-06 MED ORDER — HYDROXYZINE HCL 25 MG PO TABS: 25.0000 mg | ORAL_TABLET | Freq: Every evening | ORAL | 1 refills | Status: DC | PRN

## 2022-04-06 NOTE — Assessment & Plan Note (Signed)
Well-controlled no symptoms for several years Not currently on any treatment Previously followed by [facility] Patient is due for colonoscopy We referred to GI

## 2022-04-06 NOTE — Assessment & Plan Note (Signed)
Discussed importance of healthy weight management Discussed diet and exercise  

## 2022-04-06 NOTE — Patient Instructions (Signed)
Call [facility] to schedule a mammogram [phone]

## 2022-04-06 NOTE — Progress Notes (Signed)
I,April Miller,acting as a scribe for Lavon Paganini, MD.,have documented all relevant documentation on the behalf of Lavon Paganini, MD,as directed by  Lavon Paganini, MD while in the presence of Lavon Paganini, MD.  Annual Wellness Visit     Patient: Alison Kramer, Female    DOB: Apr 06, 1956, 66 y.o.   MRN: [id number] Visit Date: 04/06/2022  Today's Provider: Lavon Paganini, MD   Chief Complaint  Patient presents with   Medicare Wellness   Subjective    Alison Kramer is a 66 y.o. female who presents today for her Annual Wellness Visit.  HPI    Medications: Outpatient Medications Prior to Visit  Medication Sig   acetaminophen (TYLENOL) 500 MG tablet Take 2 tablets (1,000 mg total) by mouth every 6 (six) hours.   cetirizine (ZYRTEC) 10 MG tablet Take 1 tablet (10 mg total) by mouth daily.   Cholecalciferol 25 MCG (1000 UT) tablet Take 5,000 Units by mouth daily.   Coenzyme Q10 100 MG capsule Take 100 mg by mouth daily.   diphenhydrAMINE (BENADRYL) 25 MG tablet Take 25 mg by mouth at bedtime as needed for sleep.   estradiol (ESTRACE) 0.1 MG/GM vaginal cream PLACE 1 APPLICATORFUL VAGINALLY THREE TIMES A WEEK (Patient taking differently: Place 1 Applicatorful vaginally daily as needed (irritation).)   Misc Natural Products (HORNY GOAT WEED PO) Take 1 tablet by mouth daily.   Omega-3 Fatty Acids (FISH OIL) 1000 MG CAPS Take 1,000 mg by mouth daily.    omeprazole (PRILOSEC OTC) 20 MG tablet Take 20 mg by mouth daily.   Probiotic Product (PROBIOTIC ADVANCED PO) Take 1 tablet by mouth daily.    RESVERATROL PO Take 1 tablet by mouth daily.   Turmeric 400 MG CAPS Take 400 mg by mouth daily.   Zinc Acetate, Oral, (ZINC ACETATE PO) Take 1 tablet by mouth daily.   Phenylephrine-Acetaminophen 5-325 MG CAPS Take 2 tablets by mouth 4 (four) times daily as needed.   [DISCONTINUED] Ascorbic Acid (VITAMIN C PO) Take 2 tablets by mouth daily.   [DISCONTINUED] Calcium  Carbonate-Vit D-Min (CALCIUM 1200 PO) Take 1,200 mg by mouth in the morning and at bedtime. (Patient not taking: Reported on 04/06/2022)   [DISCONTINUED] enoxaparin (LOVENOX) 40 MG/0.4ML injection Inject 0.4 mLs (40 mg total) into the skin daily.   [DISCONTINUED] NON FORMULARY Apply 2 application topically daily. Soma Derm Apply daily for 5 days then off for 2 days   [DISCONTINUED] ondansetron (ZOFRAN) 4 MG tablet Take 1 tablet (4 mg total) by mouth every 6 (six) hours as needed for nausea.   [DISCONTINUED] OVER THE COUNTER MEDICATION Take 1 capsule by mouth daily. Acemannan   [DISCONTINUED] oxyCODONE (OXY IR/ROXICODONE) 5 MG immediate release tablet Take 1-2 tablets (5-10 mg total) by mouth every 4 (four) hours as needed for severe pain.   [DISCONTINUED] traMADol (ULTRAM) 50 MG tablet Take 1 tablet (50 mg total) by mouth every 4 (four) hours as needed for moderate pain.   No facility-administered medications prior to visit.    No Known Allergies  Patient Care Team: Virginia Crews, MD as PCP - General (Family Medicine)  Review of Systems  HENT:  Positive for rhinorrhea.   Musculoskeletal:  Positive for back pain and neck stiffness.  All other systems reviewed and are negative.       Objective    Vitals: BP 108/67 (BP Location: Left Arm, Patient Position: Sitting, Cuff Size: Normal)   Pulse 70   Temp 98.3 F (36.8  C) (Temporal)   Resp 16   Ht 5' 4"  (1.626 m)   Wt 195 lb (88.5 kg)   SpO2 98%   BMI 33.47 kg/m     Physical Exam Vitals reviewed.  Constitutional:      General: She is not in acute distress.    Appearance: Normal appearance. She is well-developed. She is not diaphoretic.  HENT:     Head: Normocephalic and atraumatic.     Right Ear: Tympanic membrane, ear canal and external ear normal.     Left Ear: Tympanic membrane, ear canal and external ear normal.     Nose: Nose normal.     Mouth/Throat:     Mouth: Mucous membranes are moist.     Pharynx:  Oropharynx is clear. No oropharyngeal exudate.  Eyes:     General: No scleral icterus.    Conjunctiva/sclera: Conjunctivae normal.     Pupils: Pupils are equal, round, and reactive to light.  Neck:     Thyroid: No thyromegaly.  Cardiovascular:     Rate and Rhythm: Normal rate and regular rhythm.     Pulses: Normal pulses.     Heart sounds: Normal heart sounds. No murmur heard. Pulmonary:     Effort: Pulmonary effort is normal. No respiratory distress.     Breath sounds: Normal breath sounds. No wheezing or rales.  Abdominal:     General: There is no distension.     Palpations: Abdomen is soft.     Tenderness: There is no abdominal tenderness.  Musculoskeletal:        General: No deformity.     Cervical back: Neck supple.     Right lower leg: No edema.     Left lower leg: No edema.  Lymphadenopathy:     Cervical: No cervical adenopathy.  Skin:    General: Skin is warm and dry.     Findings: No rash.  Neurological:     Mental Status: She is alert and oriented to person, place, and time. Mental status is at baseline.     Sensory: No sensory deficit.     Motor: No weakness.     Gait: Gait normal.  Psychiatric:        Mood and Affect: Mood normal.        Behavior: Behavior normal.        Thought Content: Thought content normal.      Most recent functional status assessment:    04/06/2022    2:19 PM  In your present state of health, do you have any difficulty performing the following activities:  Hearing? 0  Vision? 0  Difficulty concentrating or making decisions? 0  Walking or climbing stairs? 0  Dressing or bathing? 0  Doing errands, shopping? 0   Most recent fall risk assessment:    04/06/2022    2:18 PM  [place] in the past year? 0  Number falls in past yr: 0  Injury with Fall? 0  Risk for fall due to : No Fall Risks  Follow up Falls evaluation completed    Most recent depression screenings:    04/06/2022    2:18 PM 01/13/2022   12:31 PM  PHQ  2/9 Scores  PHQ - 2 Score 0 0  PHQ- 9 Score 6    Most recent cognitive screening:    04/04/2021    2:11 PM  6CIT Screen  What Year? 0 points  What month? 0 points  What time? 0 points  Count back from 20 0 points  Months in reverse 0 points  Repeat phrase 0 points  Total Score 0 points   Most recent Audit-C alcohol use screening    04/06/2022    2:18 PM  Alcohol Use Disorder Test (AUDIT)  1. How often do you have a drink containing alcohol? 4  2. How many drinks containing alcohol do you have on a typical day when you are drinking? 0  3. How often do you have six or more drinks on one occasion? 0  AUDIT-C Score 4  4. How often during the last year have you found that you were not able to stop drinking once you had started? 0  5. How often during the last year have you failed to do what was normally expected from you because of drinking? 0  6. How often during the last year have you needed a first drink in the morning to get yourself going after a heavy drinking session? 0  7. How often during the last year have you had a feeling of guilt of remorse after drinking? 0  8. How often during the last year have you been unable to remember what happened the night before because you had been drinking? 0  9. Have you or someone else been injured as a result of your drinking? 0  10. Has a relative or friend or a doctor or another health worker been concerned about your drinking or suggested you cut down? 0  Alcohol Use Disorder Identification Test Final Score (AUDIT) 4   A score of 3 or more in women, and 4 or more in men indicates increased risk for alcohol abuse, EXCEPT if all of the points are from question 1   No results found for any visits on 04/06/22.  Assessment & Plan     Annual wellness visit done today including the all of the following: Reviewed patient's Family Medical History Reviewed and updated list of patient's medical providers Assessment of cognitive impairment was  done Assessed patient's functional ability Established a written schedule for health screening [place] Completed and Reviewed  Exercise Activities and Dietary recommendations  Goals      care coordination activities     Care Coordination Interventions:   Needs assessment completed, SDOH screening completed AWV confirmed to be completed on 04/04/21 PCP visit previously scheduled for 04/06/22 Case management services introduced, patient verbalized having no additional needs at this time        Immunization History  Administered Date(s) Administered   Hepatitis B, adult 12/11/2017, 01/08/2018, 06/21/2018   Influenza,inj,Quad PF,6+ Mos 04/12/2015, 04/01/2018   PNEUMOCOCCAL CONJUGATE-20 04/06/2022   Pneumococcal Polysaccharide-23 04/01/2018   Td 06/21/2018   Tdap 02/14/2008   Zoster Recombinat (Shingrix) 12/11/2017, 02/11/2018   Zoster, Live 05/12/2011    Health Maintenance  Topic Date Due   COVID-19 Vaccine (1) Never done   COLONOSCOPY (Pts 45-48yr Insurance coverage will need to be confirmed)  07/05/2019   INFLUENZA VACCINE  09/03/2022 (Originally 01/03/2022)   MAMMOGRAM  07/19/2022   Medicare Annual Wellness (AWV)  04/07/2023   TETANUS/TDAP  06/21/2028   Pneumonia Vaccine 66 Years old  Completed   DEXA SCAN  Completed   Hepatitis C Screening  Completed   Zoster Vaccines- Shingrix  Completed   HPV VACCINES  Aged Out     Discussed health benefits of physical activity, and encouraged her to engage in regular exercise appropriate for her age and condition.    Problem List  Items Addressed This Visit       Digestive   Ulcerative colitis, left sided, chronic (HCC)    Well-controlled no symptoms for several years Not currently on any treatment Previously followed by [facility] Patient is due for colonoscopy We referred to GI      Relevant Orders   Ambulatory referral to Gastroenterology   CBC     Other   Insomnia    Chronic and  uncontrolled intermittent symptoms She has tried herbal remedies and melatonin She tried and failed trazodone previously She was previously taking Valium as needed Trial of hydroxyzine as needed      Encounter for annual wellness visit (AWV) in Medicare patient - Primary   Class 1 obesity without serious comorbidity with body mass index (BMI) of 33.0 to 33.9 in adult    Discussed importance of healthy weight management Discussed diet and exercise       Relevant Orders   Comprehensive metabolic panel   Lipid panel   CBC   Other Visit Diagnoses     Encounter for annual physical exam       Relevant Orders   Comprehensive metabolic panel   Lipid panel   CBC   Screen for colon cancer       Relevant Orders   Ambulatory referral to Gastroenterology   Screening mammogram for breast cancer       Relevant Orders   MM 3D SCREEN BREAST BILATERAL   Need for pneumococcal vaccine       Relevant Orders   Pneumococcal conjugate vaccine 20-valent (Prevnar 20) (Completed)        Return in about 1 year (around 04/07/2023) for CPE, AWV.     I, Lavon Paganini, MD, have reviewed all documentation for this visit. The documentation on 04/06/22 for the exam, diagnosis, procedures, and orders are all accurate and complete.   Siennah Barrasso, Dionne Bucy, MD, MPH [facility]

## 2022-04-06 NOTE — Assessment & Plan Note (Signed)
Chronic and uncontrolled intermittent symptoms She has tried herbal remedies and melatonin She tried and failed trazodone previously She was previously taking Valium as needed Trial of hydroxyzine as needed

## 2022-04-12 DIAGNOSIS — Z0000 Encounter for general adult medical examination without abnormal findings: Secondary | ICD-10-CM | POA: Diagnosis not present

## 2022-04-12 DIAGNOSIS — K5150 Left sided colitis without complications: Secondary | ICD-10-CM | POA: Diagnosis not present

## 2022-04-12 DIAGNOSIS — E669 Obesity, unspecified: Secondary | ICD-10-CM | POA: Diagnosis not present

## 2022-04-12 DIAGNOSIS — Z6833 Body mass index (BMI) 33.0-33.9, adult: Secondary | ICD-10-CM | POA: Diagnosis not present

## 2022-04-13 LAB — CBC
Hematocrit: 43.7 % (ref 34.0–46.6)
Hemoglobin: 14.3 g/dL (ref 11.1–15.9)
MCH: 30.9 pg (ref 26.6–33.0)
MCHC: 32.7 g/dL (ref 31.5–35.7)
MCV: 94 fL (ref 79–97)
Platelets: 341 10*3/uL (ref 150–450)
RBC: 4.63 x10E6/uL (ref 3.77–5.28)
RDW: 12.3 % (ref 11.7–15.4)
WBC: 7.9 10*3/uL (ref 3.4–10.8)

## 2022-04-13 LAB — COMPREHENSIVE METABOLIC PANEL
ALT: 25 IU/L (ref 0–32)
AST: 29 IU/L (ref 0–40)
Albumin/Globulin Ratio: 1.9 (ref 1.2–2.2)
Albumin: 4.6 g/dL (ref 3.9–4.9)
Alkaline Phosphatase: 63 IU/L (ref 44–121)
BUN/Creatinine Ratio: 21 (ref 12–28)
BUN: 23 mg/dL (ref 8–27)
Bilirubin Total: 0.3 mg/dL (ref 0.0–1.2)
CO2: 23 mmol/L (ref 20–29)
Calcium: 9.6 mg/dL (ref 8.7–10.3)
Chloride: 103 mmol/L (ref 96–106)
Creatinine, Ser: 1.09 mg/dL — ABNORMAL HIGH (ref 0.57–1.00)
Globulin, Total: 2.4 g/dL (ref 1.5–4.5)
Glucose: 104 mg/dL — ABNORMAL HIGH (ref 70–99)
Potassium: 4.2 mmol/L (ref 3.5–5.2)
Sodium: 146 mmol/L — ABNORMAL HIGH (ref 134–144)
Total Protein: 7 g/dL (ref 6.0–8.5)
eGFR: 56 mL/min/{1.73_m2} — ABNORMAL LOW (ref >59)

## 2022-04-13 LAB — LIPID PANEL
Chol/HDL Ratio: 4 ratio (ref 0.0–4.4)
Cholesterol, Total: 212 mg/dL — ABNORMAL HIGH (ref 100–199)
HDL: 53 mg/dL (ref >39)
LDL Chol Calc (NIH): 143 mg/dL — ABNORMAL HIGH (ref 0–99)
Triglycerides: 91 mg/dL (ref 0–149)
VLDL Cholesterol Cal: 16 mg/dL (ref 5–40)

## 2022-05-10 DIAGNOSIS — E785 Hyperlipidemia, unspecified: Secondary | ICD-10-CM | POA: Diagnosis not present

## 2022-05-10 DIAGNOSIS — R7301 Impaired fasting glucose: Secondary | ICD-10-CM | POA: Diagnosis not present

## 2022-05-10 DIAGNOSIS — R030 Elevated blood-pressure reading, without diagnosis of hypertension: Secondary | ICD-10-CM | POA: Diagnosis not present

## 2022-05-10 DIAGNOSIS — E669 Obesity, unspecified: Secondary | ICD-10-CM | POA: Diagnosis not present

## 2022-05-10 DIAGNOSIS — Z6834 Body mass index (BMI) 34.0-34.9, adult: Secondary | ICD-10-CM | POA: Diagnosis not present

## 2022-05-11 ENCOUNTER — Ambulatory Visit
Admission: RE | Admit: 2022-05-11 | Discharge: 2022-05-11 | Disposition: A | Discharge status: Home / Self Care | Payer: Medicare HMO | Source: Ambulatory Visit | Attending: Family Medicine | Admitting: Family Medicine

## 2022-05-11 DIAGNOSIS — Z1231 Encounter for screening mammogram for malignant neoplasm of breast: Secondary | ICD-10-CM | POA: Insufficient documentation

## 2022-05-12 DIAGNOSIS — E669 Obesity, unspecified: Secondary | ICD-10-CM | POA: Diagnosis not present

## 2022-05-12 DIAGNOSIS — R7309 Other abnormal glucose: Secondary | ICD-10-CM | POA: Diagnosis not present

## 2022-05-12 DIAGNOSIS — R635 Abnormal weight gain: Secondary | ICD-10-CM | POA: Diagnosis not present

## 2022-05-18 DIAGNOSIS — Z96651 Presence of right artificial knee joint: Secondary | ICD-10-CM | POA: Diagnosis not present

## 2022-06-08 DIAGNOSIS — Z0100 Encounter for examination of eyes and vision without abnormal findings: Secondary | ICD-10-CM | POA: Diagnosis not present

## 2022-06-08 DIAGNOSIS — H43813 Vitreous degeneration, bilateral: Secondary | ICD-10-CM | POA: Diagnosis not present

## 2022-06-12 DIAGNOSIS — R030 Elevated blood-pressure reading, without diagnosis of hypertension: Secondary | ICD-10-CM | POA: Diagnosis not present

## 2022-06-12 DIAGNOSIS — E669 Obesity, unspecified: Secondary | ICD-10-CM | POA: Diagnosis not present

## 2022-06-12 DIAGNOSIS — Z6834 Body mass index (BMI) 34.0-34.9, adult: Secondary | ICD-10-CM | POA: Diagnosis not present

## 2022-07-26 ENCOUNTER — Other Ambulatory Visit: Payer: Self-pay | Admitting: Pediatrics

## 2022-07-26 DIAGNOSIS — M8580 Other specified disorders of bone density and structure, unspecified site: Secondary | ICD-10-CM | POA: Diagnosis not present

## 2022-07-26 DIAGNOSIS — E669 Obesity, unspecified: Secondary | ICD-10-CM | POA: Diagnosis not present

## 2022-07-26 DIAGNOSIS — Z6833 Body mass index (BMI) 33.0-33.9, adult: Secondary | ICD-10-CM | POA: Diagnosis not present

## 2022-07-26 DIAGNOSIS — R030 Elevated blood-pressure reading, without diagnosis of hypertension: Secondary | ICD-10-CM | POA: Diagnosis not present

## 2022-08-22 DIAGNOSIS — M8580 Other specified disorders of bone density and structure, unspecified site: Secondary | ICD-10-CM | POA: Diagnosis not present

## 2022-08-22 DIAGNOSIS — R7301 Impaired fasting glucose: Secondary | ICD-10-CM | POA: Diagnosis not present

## 2022-08-22 DIAGNOSIS — E669 Obesity, unspecified: Secondary | ICD-10-CM | POA: Diagnosis not present

## 2022-08-22 DIAGNOSIS — R946 Abnormal results of thyroid function studies: Secondary | ICD-10-CM | POA: Diagnosis not present

## 2022-08-22 DIAGNOSIS — E559 Vitamin D deficiency, unspecified: Secondary | ICD-10-CM | POA: Diagnosis not present

## 2022-08-31 DIAGNOSIS — Z1211 Encounter for screening for malignant neoplasm of colon: Secondary | ICD-10-CM | POA: Diagnosis not present

## 2022-08-31 DIAGNOSIS — K6389 Other specified diseases of intestine: Secondary | ICD-10-CM | POA: Diagnosis not present

## 2022-08-31 DIAGNOSIS — Z9889 Other specified postprocedural states: Secondary | ICD-10-CM | POA: Diagnosis not present

## 2022-08-31 DIAGNOSIS — E669 Obesity, unspecified: Secondary | ICD-10-CM | POA: Diagnosis not present

## 2022-08-31 DIAGNOSIS — K5100 Ulcerative (chronic) pancolitis without complications: Secondary | ICD-10-CM | POA: Diagnosis not present

## 2022-08-31 DIAGNOSIS — Z79899 Other long term (current) drug therapy: Secondary | ICD-10-CM | POA: Diagnosis not present

## 2022-08-31 DIAGNOSIS — K6289 Other specified diseases of anus and rectum: Secondary | ICD-10-CM | POA: Diagnosis not present

## 2022-08-31 DIAGNOSIS — K635 Polyp of colon: Secondary | ICD-10-CM | POA: Diagnosis not present

## 2022-08-31 DIAGNOSIS — Z6832 Body mass index (BMI) 32.0-32.9, adult: Secondary | ICD-10-CM | POA: Diagnosis not present

## 2022-08-31 DIAGNOSIS — Z8719 Personal history of other diseases of the digestive system: Secondary | ICD-10-CM | POA: Diagnosis not present

## 2022-09-20 DIAGNOSIS — R7989 Other specified abnormal findings of blood chemistry: Secondary | ICD-10-CM | POA: Diagnosis not present

## 2022-09-20 DIAGNOSIS — Z6832 Body mass index (BMI) 32.0-32.9, adult: Secondary | ICD-10-CM | POA: Diagnosis not present

## 2022-09-20 DIAGNOSIS — E785 Hyperlipidemia, unspecified: Secondary | ICD-10-CM | POA: Diagnosis not present

## 2022-09-20 DIAGNOSIS — E669 Obesity, unspecified: Secondary | ICD-10-CM | POA: Diagnosis not present

## 2022-10-02 DIAGNOSIS — R7989 Other specified abnormal findings of blood chemistry: Secondary | ICD-10-CM | POA: Diagnosis not present

## 2022-12-20 DIAGNOSIS — E669 Obesity, unspecified: Secondary | ICD-10-CM | POA: Diagnosis not present

## 2022-12-20 DIAGNOSIS — Z780 Asymptomatic menopausal state: Secondary | ICD-10-CM | POA: Diagnosis not present

## 2022-12-20 DIAGNOSIS — E785 Hyperlipidemia, unspecified: Secondary | ICD-10-CM | POA: Diagnosis not present

## 2022-12-20 DIAGNOSIS — Z6830 Body mass index (BMI) 30.0-30.9, adult: Secondary | ICD-10-CM | POA: Diagnosis not present

## 2023-01-09 ENCOUNTER — Ambulatory Visit
Admission: RE | Admit: 2023-01-09 | Discharge: 2023-01-09 | Disposition: A | Discharge status: Home / Self Care | Payer: Medicare HMO | Source: Ambulatory Visit | Attending: Pediatrics | Admitting: Pediatrics

## 2023-01-09 DIAGNOSIS — M8580 Other specified disorders of bone density and structure, unspecified site: Secondary | ICD-10-CM

## 2023-01-09 DIAGNOSIS — N958 Other specified menopausal and perimenopausal disorders: Secondary | ICD-10-CM | POA: Diagnosis not present

## 2023-01-09 DIAGNOSIS — E349 Endocrine disorder, unspecified: Secondary | ICD-10-CM | POA: Diagnosis not present

## 2023-01-09 DIAGNOSIS — M8588 Other specified disorders of bone density and structure, other site: Secondary | ICD-10-CM | POA: Diagnosis not present

## 2023-01-25 ENCOUNTER — Telehealth: Payer: Self-pay | Admitting: Family Medicine

## 2023-01-25 NOTE — Telephone Encounter (Signed)
Contacted Alison Kramer to schedule their annual wellness visit. Patient declined to schedule AWV at this time.  Thank you,  [facility] Support [facility] Direct dial  [phone]

## 2023-01-25 NOTE — Telephone Encounter (Signed)
Please see if she would set up her annual exam with me in November

## 2023-03-26 DIAGNOSIS — H524 Presbyopia: Secondary | ICD-10-CM | POA: Diagnosis not present

## 2023-03-28 DIAGNOSIS — E785 Hyperlipidemia, unspecified: Secondary | ICD-10-CM | POA: Diagnosis not present

## 2023-03-28 DIAGNOSIS — E669 Obesity, unspecified: Secondary | ICD-10-CM | POA: Diagnosis not present

## 2023-03-28 DIAGNOSIS — Z6829 Body mass index (BMI) 29.0-29.9, adult: Secondary | ICD-10-CM | POA: Diagnosis not present

## 2023-04-17 ENCOUNTER — Ambulatory Visit: Payer: Medicare HMO | Admitting: Family Medicine

## 2023-04-17 ENCOUNTER — Encounter: Payer: Self-pay | Admitting: Family Medicine

## 2023-04-17 VITALS — BP 132/69 | HR 72 | Ht 64.0 in | Wt 167.9 lb

## 2023-04-17 DIAGNOSIS — K5150 Left sided colitis without complications: Secondary | ICD-10-CM

## 2023-04-17 DIAGNOSIS — R739 Hyperglycemia, unspecified: Secondary | ICD-10-CM

## 2023-04-17 DIAGNOSIS — Z0000 Encounter for general adult medical examination without abnormal findings: Secondary | ICD-10-CM | POA: Diagnosis not present

## 2023-04-17 DIAGNOSIS — G4700 Insomnia, unspecified: Secondary | ICD-10-CM

## 2023-04-17 DIAGNOSIS — E782 Mixed hyperlipidemia: Secondary | ICD-10-CM | POA: Diagnosis not present

## 2023-04-17 DIAGNOSIS — Z6833 Body mass index (BMI) 33.0-33.9, adult: Secondary | ICD-10-CM

## 2023-04-17 DIAGNOSIS — Z1231 Encounter for screening mammogram for malignant neoplasm of breast: Secondary | ICD-10-CM

## 2023-04-17 NOTE — Progress Notes (Signed)
Subjective:   Alison Kramer is a 67 y.o. female who presents for Medicare Annual (Subsequent) preventive examination.  Visit Complete: In person  Patient Medicare AWV questionnaire was completed by the patient on 04/17/2023; I have confirmed that all information answered by patient is correct and no changes since this date.     Discussed the use of AI scribe software for clinical note transcription with the patient, who gave verbal consent to proceed.  History of Present Illness   A patient with a history of ulcerative colitis presents for a wellness visit and physical. Her primary concern is chronic insomnia, which has been resistant to various treatments. She has tried over-the-counter remedies such as Benadryl, prescription medications including trazodone and hydroxyzine, and herbal remedies like passionflower and chamomile. However, these treatments have only provided temporary relief. She also tried melatonin, but it gave her nightmares. She has recently started taking magnesium, but it is too early to determine its effectiveness. She also mentions a history of ulcerative colitis, which is currently inactive.          Objective:    Today's Vitals   04/17/23 1321  BP: 132/69  Pulse: 72  SpO2: 99%  Weight: 167 lb 14.4 oz (76.2 kg)  Height: 5\' 4"  (1.626 m)   Body mass index is 28.82 kg/m.     05/20/2021    5:00 PM 05/20/2021   10:15 AM 05/09/2021    1:55 PM 07/31/2018   10:51 AM 08/24/2015    8:20 AM 05/17/2015    3:07 PM 03/03/2015    8:56 AM  Advanced Directives  Does Patient Have a Medical Advance Directive? No No No No     Would patient like information on creating a medical advance directive? No - Patient declined No - Patient declined No - Patient declined No - Patient declined        Information is confidential and restricted. Go to Review Flowsheets to unlock data.    Current Medications (verified) Outpatient Encounter Medications as of 04/17/2023   Medication Sig   cetirizine (ZYRTEC) 10 MG tablet Take 1 tablet (10 mg total) by mouth daily.   Coenzyme Q10 100 MG capsule Take 100 mg by mouth daily.   Misc Natural Products (HORNY GOAT WEED PO) Take 1 tablet by mouth daily.   Omega-3 Fatty Acids (FISH OIL) 1000 MG CAPS Take 1,000 mg by mouth daily.    Probiotic Product (PROBIOTIC ADVANCED PO) Take 1 tablet by mouth daily.    RESVERATROL PO Take 1 tablet by mouth daily.   Turmeric 400 MG CAPS Take 400 mg by mouth daily.   Zinc Acetate, Oral, (ZINC ACETATE PO) Take 1 tablet by mouth daily.   [DISCONTINUED] acetaminophen (TYLENOL) 500 MG tablet Take 2 tablets (1,000 mg total) by mouth every 6 (six) hours.   [DISCONTINUED] Cholecalciferol 25 MCG (1000 UT) tablet Take 5,000 Units by mouth daily.   [DISCONTINUED] diphenhydrAMINE (BENADRYL) 25 MG tablet Take 25 mg by mouth at bedtime as needed for sleep.   [DISCONTINUED] estradiol (ESTRACE) 0.1 MG/GM vaginal cream PLACE 1 APPLICATORFUL VAGINALLY THREE TIMES A WEEK (Patient taking differently: Place 1 Applicatorful vaginally daily as needed (irritation).)   [DISCONTINUED] hydrOXYzine (ATARAX) 25 MG tablet Take 1 tablet (25 mg total) by mouth at bedtime as needed.   [DISCONTINUED] omeprazole (PRILOSEC OTC) 20 MG tablet Take 20 mg by mouth daily.   [DISCONTINUED] Phenylephrine-Acetaminophen 5-325 MG CAPS Take 2 tablets by mouth 4 (four) times daily as needed.  No facility-administered encounter medications on file as of 04/17/2023.    Allergies (verified) Patient has no known allergies.   History: Past Medical History:  Diagnosis Date   ADHD (attention deficit hyperactivity disorder)    Anxiety    Colitis, ulcerative (HCC)    Depression    GERD (gastroesophageal reflux disease)    Past Surgical History:  Procedure Laterality Date   ABDOMINAL HYSTERECTOMY  1981   BACK SURGERY     BUNIONECTOMY     KNEE ARTHROPLASTY Right 05/20/2021   Procedure: COMPUTER ASSISTED TOTAL KNEE  ARTHROPLASTY - RNFA;  Surgeon: Donato Heinz, MD;  Location: [facility] ORS;  Service: Orthopedics;  Laterality: Right;   Family History  Problem Relation Age of Onset   Breast cancer Mother    Hyperlipidemia Mother    Heart Problems Father    CAD Father    Pancreatic disease Father    Hypertension Brother    Obesity Brother    Heart Problems Brother    Diabetes Brother    Colon cancer Neg Hx    Social History   Socioeconomic History   Marital status: Married    Spouse name: Sam   Number of children: 2   Years of education: 17   Highest education level: Bachelor's degree (e.g., BA, AB, BS)  Occupational History   Occupation: Marketing executive: [employer]  Tobacco Use   Smoking status: Never   Smokeless tobacco: Never  Vaping Use   Vaping status: Never Used  Substance and Sexual Activity   Alcohol use: Yes    Alcohol/week: 1.0 standard drink of alcohol    Types: 1 Glasses of wine per week    Comment: "Most Nights"   Drug use: No   Sexual activity: Yes    Birth control/protection: None  Other Topics Concern   Not on file  Social History Narrative   Not on file   Social Determinants of Health   Financial Resource Strain: Low Risk  (01/13/2022)   Overall Financial Resource Strain (CARDIA)    Difficulty of Paying Living Expenses: Not hard at all  Food Insecurity: No Food Insecurity (01/13/2022)   Hunger Vital Sign    Worried About Running Out of Food in the Last Year: Never true    Ran Out of Food in the Last Year: Never true  Transportation Needs: No Transportation Needs (01/13/2022)   PRAPARE - Administrator, Civil Service (Medical): No    Lack of Transportation (Non-Medical): No  Physical Activity: Not on file  Stress: Not on file  Social Connections: Not on file    Tobacco Counseling Counseling given: Not Answered   Clinical Intake:              How often do you need to have someone help you when you read instructions, pamphlets,  or other written materials from your doctor or pharmacy?: (P) 1 - Never         Activities of Daily Living    04/13/2023    2:38 PM  In your present state of health, do you have any difficulty performing the following activities:  Hearing? 0  Vision? 0  Difficulty concentrating or making decisions? 0  Walking or climbing stairs? 0  Dressing or bathing? 0  Doing errands, shopping? 0  Preparing Food and eating ? N  Using the Toilet? N  In the past six months, have you accidently leaked urine? N  Do you have problems with loss of  bowel control? N  Managing your Medications? N  Managing your Finances? N  Housekeeping or managing your Housekeeping? N    Patient Care Team: Erasmo Downer, MD as PCP - General (Family Medicine)  Indicate any recent Medical Services you may have received from other than [facility] providers in the past year (date may be approximate).     Assessment:   This is a routine wellness examination for Irini.  Hearing/Vision screen No results found.   Goals Addressed   None   Depression Screen    04/17/2023    1:26 PM 04/06/2022    2:18 PM 01/13/2022   12:31 PM 04/04/2021    2:03 PM 03/29/2020    1:42 PM 10/12/2017    3:17 PM 10/10/2016    9:10 AM  PHQ 2/9 Scores  PHQ - 2 Score 0 0 0 0 0 1 0  PHQ- 9 Score 3 6  7 1  3     Fall Risk    04/17/2023    1:26 PM 04/13/2023    2:38 PM 04/06/2022    2:18 PM 04/03/2022   11:30 AM 04/04/2021    2:03 PM  Fall Risk   Falls in the past year? 0 0 0 0 0  Number falls in past yr: 0 0 0 0 0  Injury with Fall? 0 0 0 0 0  Risk for fall due to : No Fall Risks  No Fall Risks  No Fall Risks  Follow up Falls evaluation completed  Falls evaluation completed  Falls evaluation completed    MEDICARE RISK AT HOME: Medicare Risk at Home Any stairs in or around the home?: (P) No If so, are there any without handrails?: (P) No Home free of loose throw rugs in walkways, pet beds, electrical cords, etc?: (P)  Yes Adequate lighting in your home to reduce risk of falls?: (P) Yes Life alert?: (P) No Use of a cane, walker or w/c?: (P) No Grab bars in the bathroom?: (P) No Shower chair or bench in shower?: (P) Yes Elevated toilet seat or a handicapped toilet?: (P) No  TIMED UP AND GO:  Was the test performed?  No    Cognitive Function:        04/17/2023    1:56 PM 04/04/2021    2:11 PM 03/29/2020    1:17 PM  6CIT Screen  What Year? 0 points 0 points 0 points  What month? 0 points 0 points 0 points  What time? 0 points 0 points 0 points  Count back from 20 0 points 0 points 0 points  Months in reverse 0 points 0 points 0 points  Repeat phrase 0 points 0 points 2 points  Total Score 0 points 0 points 2 points    Immunizations Immunization History  Administered Date(s) Administered   Hepatitis B, ADULT 12/11/2017, 01/08/2018, 06/21/2018   Influenza,inj,Quad PF,6+ Mos 04/12/2015, 04/01/2018   PNEUMOCOCCAL CONJUGATE-20 04/06/2022   Pneumococcal Polysaccharide-23 04/01/2018   Td 06/21/2018   Tdap 02/14/2008   Zoster Recombinant(Shingrix) 12/11/2017, 02/11/2018   Zoster, Live 05/12/2011    TDAP status: Up to date  Flu Vaccine status: Declined, Education has been provided regarding the importance of this vaccine but patient still declined. Advised may receive this vaccine at local pharmacy or Health Dept. Aware to provide a copy of the vaccination record if obtained from local pharmacy or Health Dept. Verbalized acceptance and understanding.  Pneumococcal vaccine status: Up to date  Covid-19 vaccine status: Declined, Education has been provided  regarding the importance of this vaccine but patient still declined. Advised may receive this vaccine at local pharmacy or Health Dept.or vaccine clinic. Aware to provide a copy of the vaccination record if obtained from local pharmacy or Health Dept. Verbalized acceptance and understanding.  Qualifies for Shingles Vaccine? Yes    Shingrix  Completed?: Yes  Screening Tests Health Maintenance  Topic Date Due   COVID-19 Vaccine (1) Never done   INFLUENZA VACCINE  09/03/2023 (Originally 01/04/2023)   Colonoscopy  08/31/2023   Medicare Annual Wellness (AWV)  04/16/2024   MAMMOGRAM  05/11/2024   DTaP/Tdap/Td (3 - Td or Tdap) 06/21/2028   Pneumonia Vaccine 21+ Years old  Completed   DEXA SCAN  Completed   Hepatitis C Screening  Completed   Zoster Vaccines- Shingrix  Completed   HPV VACCINES  Aged Out    Health Maintenance  Health Maintenance Due  Topic Date Due   COVID-19 Vaccine (1) Never done    Colorectal cancer screening: Type of screening: Colonoscopy. Completed 2024. Repeat every 1 years  Mammogram status: Completed 05/2022. Repeat every year  DEXA completed  Lung Cancer Screening: (Low Dose CT Chest recommended if Age 78-80 years, 20 pack-year currently smoking OR have quit w/in 15years.) does not qualify.   Lung Cancer Screening Referral: n/a  Additional Screening:  Hepatitis C Screening: does qualify; Completed   Vision Screening: Recommended annual ophthalmology exams for early detection of glaucoma and other disorders of the eye. Is the patient up to date with their annual eye exam?  Yes    Dental Screening: Recommended annual dental exams for proper oral hygiene  Diabetic Foot Exam: n/a  Community Resource Referral / Chronic Care Management: CRR required this visit?  No   CCM required this visit?  No     Plan:     I have personally reviewed and noted the following in the patient's chart:   Medical and social history Use of alcohol, tobacco or illicit drugs  Current medications and supplements including opioid prescriptions. Patient is not currently taking opioid prescriptions. Functional ability and status Nutritional status Physical activity Advanced directives List of other physicians Hospitalizations, surgeries, and ER visits in previous 12 months Vitals Screenings to include  cognitive, depression, and falls Referrals and appointments  In addition, I have reviewed and discussed with patient certain preventive protocols, quality metrics, and best practice recommendations. A written personalized care plan for preventive services as well as general preventive health recommendations were provided to patient.    Problem List Items Addressed This Visit       Digestive   Ulcerative colitis, left sided, chronic (HCC)    Well-controlled no symptoms for several years Not currently on any treatment Previously followed by [facility]      Relevant Orders   CBC w/Diff/Platelet     Other   Insomnia   Class 1 obesity without serious comorbidity with body mass index (BMI) of 33.0 to 33.9 in adult   RESOLVED: Encounter for annual wellness visit (AWV) in Medicare patient - Primary   Other Visit Diagnoses     Encounter for annual physical exam       Relevant Orders   CBC w/Diff/Platelet   Comprehensive metabolic panel   Lipid panel   MM 3D SCREENING MAMMOGRAM BILATERAL BREAST   Hemoglobin A1c   Breast cancer screening by mammogram       Relevant Orders   MM 3D SCREENING MAMMOGRAM BILATERAL BREAST   Moderate mixed hyperlipidemia not requiring statin  therapy       Relevant Orders   Comprehensive metabolic panel   Lipid panel   Hyperglycemia       Relevant Orders   Hemoglobin A1c           Insomnia Chronic insomnia with difficulty initiating and maintaining sleep. Previous treatments include Benadryl, trazodone, hydroxyzine, melatonin (caused nightmares), and magnesium (previously caused diarrhea but currently tolerated). Discussed limited options and potential side effects of Ambien and Belsomra, which can be habit-forming and may affect memory. Recommended trying a lower dose of children's melatonin, which may be more effective and less likely to cause nightmares. - Try children's dose of melatonin  Elevated Blood Sugar Previous blood sugar slightly  elevated. Plan to check A1c to get a three-month average of blood sugars. - Order A1c test  General Health Maintenance Routine wellness visit and physical examination. Reviewed and updated medication list. No issues with living expenses, transportation, hearing, or self-care. Depression screening minimal, no falls in the last year, and safety measures in place at home. Cognitive function normal. Up to date on tetanus, pneumonia, and shingles vaccinations. Declined flu and COVID vaccines. Due for colon cancer screening next year and mammogram in December. Bone density scan completed two months ago. Regular dental and eye exams. - Order mammogram for December - Order labs for cholesterol, kidney and liver function, blood counts, and A1c - Provide after-visit summary in MyChart or print if preferred - Schedule next year's physical on the way out.        Shirlee Latch, MD   04/17/2023

## 2023-04-17 NOTE — Assessment & Plan Note (Signed)
Well-controlled no symptoms for several years Not currently on any treatment Previously followed by [facility]

## 2023-04-18 LAB — CBC WITH DIFFERENTIAL/PLATELET
Basophils Absolute: 0.1 10*3/uL (ref 0.0–0.2)
Basos: 1 %
EOS (ABSOLUTE): 0.3 10*3/uL (ref 0.0–0.4)
Eos: 4 %
Hematocrit: 39.6 % (ref 34.0–46.6)
Hemoglobin: 13.5 g/dL (ref 11.1–15.9)
Immature Grans (Abs): 0 10*3/uL (ref 0.0–0.1)
Immature Granulocytes: 0 %
Lymphocytes Absolute: 2 10*3/uL (ref 0.7–3.1)
Lymphs: 31 %
MCH: 32.1 pg (ref 26.6–33.0)
MCHC: 34.1 g/dL (ref 31.5–35.7)
MCV: 94 fL (ref 79–97)
Monocytes Absolute: 0.6 10*3/uL (ref 0.1–0.9)
Monocytes: 10 %
Neutrophils Absolute: 3.4 10*3/uL (ref 1.4–7.0)
Neutrophils: 54 %
Platelets: 310 10*3/uL (ref 150–450)
RBC: 4.21 x10E6/uL (ref 3.77–5.28)
RDW: 12.8 % (ref 11.7–15.4)
WBC: 6.3 10*3/uL (ref 3.4–10.8)

## 2023-04-18 LAB — COMPREHENSIVE METABOLIC PANEL
ALT: 18 [IU]/L (ref 0–32)
AST: 20 [IU]/L (ref 0–40)
Albumin: 4.6 g/dL (ref 3.9–4.9)
Alkaline Phosphatase: 65 [IU]/L (ref 44–121)
BUN/Creatinine Ratio: 8 — ABNORMAL LOW (ref 12–28)
BUN: 8 mg/dL (ref 8–27)
Bilirubin Total: 0.3 mg/dL (ref 0.0–1.2)
CO2: 22 mmol/L (ref 20–29)
Calcium: 9.4 mg/dL (ref 8.7–10.3)
Chloride: 102 mmol/L (ref 96–106)
Creatinine, Ser: 1.01 mg/dL — ABNORMAL HIGH (ref 0.57–1.00)
Globulin, Total: 2.2 g/dL (ref 1.5–4.5)
Glucose: 91 mg/dL (ref 70–99)
Potassium: 4.5 mmol/L (ref 3.5–5.2)
Sodium: 139 mmol/L (ref 134–144)
Total Protein: 6.8 g/dL (ref 6.0–8.5)
eGFR: 61 mL/min/{1.73_m2} (ref >59)

## 2023-04-18 LAB — LIPID PANEL
Chol/HDL Ratio: 3.8 ratio (ref 0.0–4.4)
Cholesterol, Total: 192 mg/dL (ref 100–199)
HDL: 50 mg/dL (ref >39)
LDL Chol Calc (NIH): 121 mg/dL — ABNORMAL HIGH (ref 0–99)
Triglycerides: 118 mg/dL (ref 0–149)
VLDL Cholesterol Cal: 21 mg/dL (ref 5–40)

## 2023-04-18 LAB — HEMOGLOBIN A1C
Est. average glucose Bld gHb Est-mCnc: 108 mg/dL
Hgb A1c MFr Bld: 5.4 % (ref 4.8–5.6)

## 2023-05-14 ENCOUNTER — Ambulatory Visit
Admission: RE | Admit: 2023-05-14 | Discharge: 2023-05-14 | Disposition: A | Discharge status: Home / Self Care | Payer: Medicare HMO | Source: Ambulatory Visit | Attending: Family Medicine | Admitting: Family Medicine

## 2023-05-14 DIAGNOSIS — Z1231 Encounter for screening mammogram for malignant neoplasm of breast: Secondary | ICD-10-CM | POA: Diagnosis not present

## 2023-05-14 DIAGNOSIS — Z0000 Encounter for general adult medical examination without abnormal findings: Secondary | ICD-10-CM

## 2023-06-12 DIAGNOSIS — H43813 Vitreous degeneration, bilateral: Secondary | ICD-10-CM | POA: Diagnosis not present

## 2023-06-12 DIAGNOSIS — H04123 Dry eye syndrome of bilateral lacrimal glands: Secondary | ICD-10-CM | POA: Diagnosis not present

## 2023-06-12 DIAGNOSIS — Z961 Presence of intraocular lens: Secondary | ICD-10-CM | POA: Diagnosis not present

## 2023-10-11 ENCOUNTER — Other Ambulatory Visit: Payer: Self-pay

## 2023-10-11 ENCOUNTER — Ambulatory Visit: Payer: Self-pay

## 2023-10-11 ENCOUNTER — Encounter: Payer: Self-pay | Admitting: Internal Medicine

## 2023-10-11 ENCOUNTER — Ambulatory Visit (INDEPENDENT_AMBULATORY_CARE_PROVIDER_SITE_OTHER): Admitting: Internal Medicine

## 2023-10-11 VITALS — BP 120/74 | HR 82 | Temp 98.3°F | Resp 16 | Ht 64.0 in | Wt 167.0 lb

## 2023-10-11 DIAGNOSIS — K5150 Left sided colitis without complications: Secondary | ICD-10-CM | POA: Diagnosis not present

## 2023-10-11 DIAGNOSIS — K625 Hemorrhage of anus and rectum: Secondary | ICD-10-CM | POA: Diagnosis not present

## 2023-10-11 DIAGNOSIS — K51919 Ulcerative colitis, unspecified with unspecified complications: Secondary | ICD-10-CM

## 2023-10-11 NOTE — Telephone Encounter (Signed)
 Ok to place referral to GI for UC, but in the meantime, does anyone have acutes open tomorrow? I would like for her to be seen tomorrow or Monday if possible

## 2023-10-11 NOTE — Telephone Encounter (Signed)
 Chief Complaint: rectal bleeding Symptoms: rectal bleeding, abdominal pain Frequency: 1 month, worsening Pertinent Negatives: Patient denies fever, clots, large amount of blood loss that turns the toilet water red or pink, dizziness, weakness, pallor, vomiting, blood thinners, diarrhea, constipation Disposition: [] ED /[] Urgent Care (no appt availability in office) / [x] Appointment(In office/virtual)/ []  [facility]/ [] Home Care/ [] Refused Recommended Disposition /[] [place] [facility]/ []  Follow-up with PCP Additional Notes: Pt reports hx of UC and rectal bleeding for 1 month. Pt states bleeding has worsened. Pt reports blood and mucus with bowel movements and flatulence. Denies clots or blood loss that fills the toilet or turns the water red or pink. She also endorses 3/10 abdominal pain. Pt denies vomiting. Pt denies diarrhea or constipation. Pt denies dizziness, weakness, pallor, fever, blood thinners. Pt states she was in remission but now symptoms have presented again. Pt states she was once prescribed a medication called Lialda for UC by a GI doctor. She called GI for a refill and they told her she would need to be referred to them again. RN advised pt she should be seen within 24 hours. No availability at [facility] during that time. RN called the CAL. CAL asked to speak with the pt to schedule her. RN transferred the call.   Copied from CRM [id number]. Topic: Clinical - Red Word Triage >> Oct 11, 2023 11:17 AM Rennis Case wrote: Red Word that prompted transfer to Nurse Triage: blood and mucous in stool Reason for Disposition  MODERATE rectal bleeding (small blood clots, passing blood without stool, or toilet water turns red)  Answer Assessment - Initial Assessment Questions 1. APPEARANCE of BLOOD: "What color is it?" "Is it passed separately, on the surface of the stool, or mixed in with the stool?"      Blood and mucous in stools. Has blood and mucous on the tissue when she wipes but it has  not fills the toilet bowl or changed the color of the water. So far she has not had diarrhea with it.  2. AMOUNT: "How much blood was passed?"      On the tissue, "a little" blood mixed in with stools 3. FREQUENCY: "How many times has blood been passed with the stools?"      For 1 month  4. ONSET: "When was the blood first seen in the stools?" (Days or weeks)      Mild for 1 month, now it is with every BM  5. DIARRHEA: "Is there also some diarrhea?" If Yes, ask: "How many diarrhea stools in the past 24 hours?"      No  6. CONSTIPATION: "Do you have constipation?" If Yes, ask: "How bad is it?"     Have not been constipated since she started bleeding, was constipated before then  7. RECURRENT SYMPTOMS: "Have you had blood in your stools before?" If Yes, ask: "When was the last time?" and "What happened that time?"     Yes - "I was diagnosed with UC 20 yrs ago, I have had this off and on", been in remission 8. BLOOD THINNERS: "Do you take any blood thinners?" (e.g., Coumadin/warfarin, Pradaxa/dabigatran, aspirin)     No  9. OTHER SYMPTOMS: "Do you have any other symptoms?"  (e.g., abdomen pain, vomiting, dizziness, fever)     Denies dizziness, weakness, pallor. "A little" abdominal pain - 3-4/10. Abdominal pain for the last 2 wks. Denies fever. Denies vomiting   "Before when my diease was active I was taking Lialda, I called GI  doctor to take that med again, she said I needed a referral"  Protocols used: Rectal Bleeding-A-AH

## 2023-10-11 NOTE — Progress Notes (Signed)
   Acute Office Visit  Subjective:     Patient ID: Alison Kramer, female    DOB: 1955/12/17, 68 y.o.   MRN: [id number]  Chief Complaint  Patient presents with   Rectal Bleeding    Rectal Bleeding  Associated symptoms include abdominal pain. Pertinent negatives include no fever and no diarrhea.   Patient is in today for UC flare.   Discussed the use of AI scribe software for clinical note transcription with the patient, who gave verbal consent to proceed.  History of Present Illness Alison Kramer is a 68 year old female with chronic pancolonic ulcerative colitis who presents with a flare of symptoms.  She experiences increased frequency of bright red, bloody mucous bleeding, primarily noticed on toilet paper, occurring almost every time she uses the bathroom and occasionally with flatus. There is no rectal pain, but she has occasional mild twinges of lower abdominal pain. She has experienced more severe episodes in the past. A colonoscopy in 2024 revealed ascending colon polyps. She has been treated with Lialda (mesalamine) for ulcerative colitis, with past flares sometimes requiring steroids.   Review of Systems  Constitutional:  Negative for chills and fever.  Gastrointestinal:  Positive for abdominal pain, blood in stool and hematochezia. Negative for constipation, diarrhea and melena.        Objective:    BP 120/74 (Cuff Size: Normal)   Pulse 82   Temp 98.3 F (36.8 C) (Oral)   Resp 16   Ht 5\' 4"  (1.626 m)   Wt 167 lb (75.8 kg)   SpO2 94%   BMI 28.67 kg/m  BP Readings from Last 3 Encounters:  10/11/23 120/74  04/17/23 132/69  04/06/22 108/67   Wt Readings from Last 3 Encounters:  10/11/23 167 lb (75.8 kg)  04/17/23 167 lb 14.4 oz (76.2 kg)  04/06/22 195 lb (88.5 kg)      Physical Exam Constitutional:      Appearance: Normal appearance.  HENT:     Head: Normocephalic and atraumatic.  Eyes:     Conjunctiva/sclera: Conjunctivae normal.  Cardiovascular:      Rate and Rhythm: Normal rate and regular rhythm.  Pulmonary:     Effort: Pulmonary effort is normal.     Breath sounds: Normal breath sounds.  Abdominal:     General: Bowel sounds are normal. There is no distension.     Palpations: Abdomen is soft. There is no mass.     Tenderness: There is abdominal tenderness. There is no guarding or rebound.     Hernia: No hernia is present.     Comments: Mild ptp in LLQ  Neurological:     Mental Status: She is alert.     No results found for any visits on 10/11/23.      Assessment & Plan:   Assessment & Plan Chronic pancolonic ulcerative colitis Experiencing a flare with rectal bleeding and increased frequency. Managed previously with Lialda and steroids. Requires gastroenterology referral for potential immunosuppressant or steroid treatment. - Place urgent referral to gastroenterology.  - Ambulatory referral to Gastroenterology    Return if symptoms worsen or fail to improve.  Rockney Cid, DO

## 2023-10-11 NOTE — Telephone Encounter (Signed)
No all good

## 2023-11-05 ENCOUNTER — Other Ambulatory Visit (INDEPENDENT_AMBULATORY_CARE_PROVIDER_SITE_OTHER)

## 2023-11-05 ENCOUNTER — Ambulatory Visit: Admitting: Gastroenterology

## 2023-11-05 ENCOUNTER — Encounter: Payer: Self-pay | Admitting: Gastroenterology

## 2023-11-05 VITALS — BP 114/68 | HR 89 | Ht 64.0 in | Wt 172.6 lb

## 2023-11-05 DIAGNOSIS — R197 Diarrhea, unspecified: Secondary | ICD-10-CM

## 2023-11-05 DIAGNOSIS — K625 Hemorrhage of anus and rectum: Secondary | ICD-10-CM | POA: Diagnosis not present

## 2023-11-05 DIAGNOSIS — R152 Fecal urgency: Secondary | ICD-10-CM

## 2023-11-05 DIAGNOSIS — R109 Unspecified abdominal pain: Secondary | ICD-10-CM

## 2023-11-05 DIAGNOSIS — K5150 Left sided colitis without complications: Secondary | ICD-10-CM | POA: Diagnosis not present

## 2023-11-05 DIAGNOSIS — M2550 Pain in unspecified joint: Secondary | ICD-10-CM

## 2023-11-05 LAB — CBC WITH DIFFERENTIAL/PLATELET
Basophils Absolute: 0.1 10*3/uL (ref 0.0–0.1)
Basophils Relative: 0.7 % (ref 0.0–3.0)
Eosinophils Absolute: 0.3 10*3/uL (ref 0.0–0.7)
Eosinophils Relative: 3.8 % (ref 0.0–5.0)
HCT: 38.7 % (ref 36.0–46.0)
Hemoglobin: 12.9 g/dL (ref 12.0–15.0)
Lymphocytes Relative: 29.8 % (ref 12.0–46.0)
Lymphs Abs: 2.4 10*3/uL (ref 0.7–4.0)
MCHC: 33.3 g/dL (ref 30.0–36.0)
MCV: 92.7 fl (ref 78.0–100.0)
Monocytes Absolute: 0.8 10*3/uL (ref 0.1–1.0)
Monocytes Relative: 9.5 % (ref 3.0–12.0)
Neutro Abs: 4.6 10*3/uL (ref 1.4–7.7)
Neutrophils Relative %: 56.2 % (ref 43.0–77.0)
Platelets: 360 10*3/uL (ref 150.0–400.0)
RBC: 4.17 Mil/uL (ref 3.87–5.11)
RDW: 13.6 % (ref 11.5–15.5)
WBC: 8.1 10*3/uL (ref 4.0–10.5)

## 2023-11-05 LAB — COMPREHENSIVE METABOLIC PANEL WITH GFR
ALT: 9 U/L (ref 0–35)
AST: 15 U/L (ref 0–37)
Albumin: 4.5 g/dL (ref 3.5–5.2)
Alkaline Phosphatase: 56 U/L (ref 39–117)
BUN: 13 mg/dL (ref 6–23)
CO2: 27 meq/L (ref 19–32)
Calcium: 8.9 mg/dL (ref 8.4–10.5)
Chloride: 106 meq/L (ref 96–112)
Creatinine, Ser: 0.96 mg/dL (ref 0.40–1.20)
GFR: 60.6 mL/min (ref >60.00)
Glucose, Bld: 96 mg/dL (ref 70–99)
Potassium: 3.9 meq/L (ref 3.5–5.1)
Sodium: 141 meq/L (ref 135–145)
Total Bilirubin: 0.3 mg/dL (ref 0.2–1.2)
Total Protein: 6.8 g/dL (ref 6.0–8.3)

## 2023-11-05 LAB — SEDIMENTATION RATE: Sed Rate: 13 mm/h (ref 0–30)

## 2023-11-05 LAB — VITAMIN D 25 HYDROXY (VIT D DEFICIENCY, FRACTURES): VITD: 24.09 ng/mL — ABNORMAL LOW (ref 30.00–100.00)

## 2023-11-05 LAB — C-REACTIVE PROTEIN: CRP: <1 mg/dL (ref 0.5–20.0)

## 2023-11-05 NOTE — Progress Notes (Addendum)
 Chief Complaint:Ulcerative colitis, left sided, chronic , rectal bleeding Primary GI Doctor: Dr. 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 She was followed by Dr. Yvone Herd at [facility], who was referred to me by Mazie Speed, MD on 5/8/215 for Ulcerative colitis, left sided, chronic , rectal bleeding.  IBD history (per [facility] records) History of iritis.  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  In the past, she has used Rowasa, Uceris foam and Prednisone   02/2018, DEXA scan revealed osteopenia.  In 2020, she received one Infliximab  induction infusion but stopped due to change in insurance.   Interval History    Patient presents for evaluation of her Ulcerative colitis. She reports she not been on any treatment since 2020 and remained symptom free. In the past she has been on Lialda 4.8g daily for maintenance and steroid tapers for flares in the past. She reports she has not required any steroids or been hospitalized. She states since mid April she started to experience increase stool frequency and noted BRB with wiping. She states her normal is 1-2 formed stools  per day. She states as of recent she is having 6-8 stools that are all loose. Sometimes she will pass only pink tinge mucus with a little stool. She notes the stool has a odor to it. She denies being exposed to anyone sick but did recently travel to see her brother. She notes LLQ abdominal discomfort when she needs to have bowel movement. She notes urgency with bowel movements as well.   She states her appetite is good. No unintentional weight loss.  She notes joint pain in bilateral hips and knees.  Nonsmoker.   Drinks occasional glass of wine.   No blood thinners.  Patient's family history includes brother with lung CA. No family history of IBD.   GI procedures: 08/31/22  colonoscopy with Dr. Yvone Herd The perianal and digital rectal exam were normal. .A diffuse area of moderate melanosis was found in the entire colon. Three sessile polyps were found in the ascending colon  and cecum. The polyps were 6 to 8 mm in size. Two sessile polyps were found in the sigmoid colon and descending colon. The polyps were 5 to 6 mm in size. The colonic mucosa appear diffusely scarred with loss of haustral markings but devoid of active inflammation. Mayo 0.  Path: A. Colon, ascending, endoscopic biopsy: Moderate chronic active colitis. Negative for dysplasia. B. Colon, transverse, endoscopic biopsy: Mild chronic active colitis. Negative for dysplasia. C. Colon, descending, endoscopic biopsy: Mild chronic active colitis. Negative for dysplasia. D. Colon, sigmoid, endoscopic biopsy: Mild chronic active colitis. Negative for dysplasia. E. Colon, sigmoid, polyp, endoscopic polypectomy: Polypoid fragment of adenomatous mucosa. See comment.  Comment: The adenomatous mucosa Cailynn Bodnar represent either sporadic adenoma or inflammatory bowel disease associated polypoid low grade dysplasia. Due to the ongoing activity in the sigmoid colon (part D), polypoid IBD-associated dysplasia is favored.   F. Rectum, endoscopic biopsy: Mild chronic active colitis. Negative for dysplasia.  Colonoscopy 06/2018: scattered mild inflammation in recto-sigmoid, descending colon, ascending colon and 4mm adenoma in sigmoid colon.   Colonoscopy 10/2017 -diffuse mild inflammation throughout the entire colon. Internal hemorrhoids were noted. Biopsies showed moderate to severe chronic active colitis throughout the colon. A rectal polyp was removed which showed patchy atypical epithelial changes indeterminate for dysplasia but reactive changes were favored.   Colonoscopy 08/2015 - endoscopically and  histologically normal without evidence of active inflammation in the colon or terminal ileum. One hyperplastic rectal  polyp.   Colonoscopy 12/2010 - mildly erythematous mucosa in the sigmoid colon. The remainder of the colon and terminal ileum appeared normal. A few hyperplastic polyps were present. Biopsies were normal with the exception of mild, chronic focally active colitis in the rectosigmoid colon.   No imaging.   Wt Readings from Last 3 Encounters:  11/05/23 172 lb 9 oz (78.3 kg)  10/11/23 167 lb (75.8 kg)  04/17/23 167 lb 14.4 oz (76.2 kg)      Past Medical History:  Diagnosis Date   ADHD (attention deficit hyperactivity disorder)    Anxiety    Colitis, ulcerative (HCC)    Depression    GERD (gastroesophageal reflux disease)     Past Surgical History:  Procedure Laterality Date   ABDOMINAL HYSTERECTOMY  1981   BACK SURGERY     BUNIONECTOMY     KNEE ARTHROPLASTY Right 05/20/2021   Procedure: COMPUTER ASSISTED TOTAL KNEE ARTHROPLASTY - RNFA;  Surgeon: Arlyne Lame, MD;  Location: [facility] ORS;  Service: Orthopedics;  Laterality: Right;    Current Outpatient Medications  Medication Sig Dispense Refill   buPROPion (WELLBUTRIN SR) 150 MG 12 hr tablet Take 1 tablet by mouth 2 (two) times daily.     cetirizine  (ZYRTEC ) 10 MG tablet Take 1 tablet (10 mg total) by mouth daily. 90 tablet 1   Coenzyme Q10 100 MG capsule Take 100 mg by mouth daily.     Misc Natural Products (HORNY GOAT WEED PO) Take 1 tablet by mouth daily.     naltrexone (DEPADE) 50 MG tablet Take 50 mg by mouth daily.     Omega-3 Fatty Acids (FISH OIL) 1000 MG CAPS Take 1,000 mg by mouth daily.      Probiotic Product (PROBIOTIC ADVANCED PO) Take 1 tablet by mouth daily.      RESVERATROL PO Take 1 tablet by mouth daily.     Turmeric 400 MG CAPS Take 400 mg by mouth daily.     Zinc Acetate, Oral, (ZINC ACETATE PO) Take 1 tablet by mouth daily.     No current facility-administered medications for this visit.    Allergies as of 11/05/2023   (No Known Allergies)    Family History  Problem Relation Age of Onset   Breast  cancer Mother    Hyperlipidemia Mother    Heart Problems Father    CAD Father    Pancreatic disease Father    Hypertension Brother    Obesity Brother    Heart Problems Brother    Diabetes Brother    Colon cancer Neg Hx     Review of Systems:    Constitutional: No weight loss, fever, chills, weakness or fatigue HEENT: Eyes: No change in vision               Ears, Nose, Throat:  No change in hearing or congestion Skin: No rash or itching Cardiovascular: No chest pain, chest pressure or palpitations   Respiratory: No SOB or cough Gastrointestinal: See HPI and otherwise negative Genitourinary: No dysuria or change in urinary frequency Neurological: No headache, dizziness or syncope Musculoskeletal: No new muscle or joint pain Hematologic: No bleeding or bruising Psychiatric: No history of depression or anxiety    Physical Exam:  Vital signs: BP 114/68   Pulse 89   Ht 5\' 4"  (1.626 m)   Wt 172 lb 9 oz (78.3 kg)   BMI 29.62  kg/m   Constitutional:  Pleasant  female appears to be in NAD, Well developed, Well nourished, alert and cooperative Throat: Oral cavity and pharynx without inflammation, swelling or lesion.  Respiratory: Respirations even and unlabored. Lungs clear to auscultation bilaterally.   No wheezes, crackles, or rhonchi.  Cardiovascular: Normal S1, S2. Regular rate and rhythm. No peripheral edema, cyanosis or pallor.  Gastrointestinal:  Soft, nondistended, LLQ abdominal tenderness with palpation. No rebound or guarding. Normal bowel sounds. No appreciable masses or hepatomegaly. Rectal:  Not performed.  Msk:  Symmetrical without gross deformities. Without edema, no deformity or joint abnormality.  Neurologic:  Alert and  oriented x4;  grossly normal neurologically.  Skin:   Dry and intact without significant lesions or rashes. Psychiatric: Oriented to person, place and time. Demonstrates good judgement and reason without abnormal affect or behaviors.  RELEVANT LABS  AND IMAGING: CBC    Latest Ref Rng & Units 04/17/2023    2:12 PM 04/12/2022    8:47 AM 04/18/2021    8:52 AM  CBC  WBC 3.4 - 10.8 x10E3/uL 6.3  7.9  7.3   Hemoglobin 11.1 - 15.9 g/dL 54.0  98.1  19.1   Hematocrit 34.0 - 46.6 % 39.6  43.7  39.5   Platelets 150 - 450 x10E3/uL 310  341  341      CMP     Latest Ref Rng & Units 04/17/2023    2:12 PM 04/12/2022    8:47 AM 04/18/2021    8:52 AM  CMP  Glucose 70 - 99 mg/dL 91  478  87   BUN 8 - 27 mg/dL 8  23  19    Creatinine 0.57 - 1.00 mg/dL 2.95  6.21  3.08   Sodium 134 - 144 mmol/L 139  146  141   Potassium 3.5 - 5.2 mmol/L 4.5  4.2  4.3   Chloride 96 - 106 mmol/L 102  103  105   CO2 20 - 29 mmol/L 22  23  23    Calcium  8.7 - 10.3 mg/dL 9.4  9.6  9.4   Total Protein 6.0 - 8.5 g/dL 6.8  7.0  6.9   Total Bilirubin 0.0 - 1.2 mg/dL 0.3  0.3  0.4   Alkaline Phos 44 - 121 IU/L 65  63  56   AST 0 - 40 IU/L 20  29  19    ALT 0 - 32 IU/L 18  25  15       Lab Results  Component Value Date   TSH 2.440 08/09/2015     Assessment: Encounter Diagnoses  Name Primary?   Ulcerative colitis, left sided, chronic (HCC) Yes   Rectal bleeding    Diarrhea, unspecified type    Rectal urgency    Abdominal discomfort    Arthralgia, unspecified joint      68 year old female patient with past medical history of ulcerative proctosigmoiditis/left-sided ulcerative colitis diagnosed in 1998 with extension to pancolitis 10/2017. Per patient she has remained asymptomatic since 2020 on no treatment, until the last 6 weeks when she started to experience bloody mucousy diarrhea, abdominal pain and worsening arthropathy. Will obtain baseline inflammatory markers and GI stool profile to r/o any enteric infection. Last colonoscopy 08/2022 with Sanari Offner score 0.  Will discuss case with Dr. Yvone Herd.  Plan: - Check CBC, CMP, Iron panel, TIBC, CRP, ESR, vitamin D  -Check GI profile with cdiff PCR - Check fecal calprotectin    Thank you for the courtesy of this consult.  Please  call me with any questions or concerns.   Natayla Cadenhead, FNP-C [facility] 11/05/2023, 5:10 PM  Cc: Mazie Speed, MD  I have reviewed the clinic note as outlined by Arlon Lamb, NP and agree with the assessment, plan and medical decision making.  Ms. Mcelrath has a previous history of ulcerative proctosigmoiditis/left-sided colitis diagnosed in 1998 with extension to pancolitis 10/2017 who is known to me from previous care at [facility].  In the past she was treated with oral and rectal mesalamine as well as prednisone .  She received 1 dose of infliximab  in 2020 but did not continue due to insurance issues.  She has been off of therapy for many years and last colonoscopy in 2024 confirmed remission of disease.  Over the last 6 weeks she has had increasing GI symptoms.  I agree with laboratory and stool evaluation.  Can consider resumption of dose optimized oral mesalamine 4.8 g daily as well as a potential course of corticosteroids.  Unsure if she mentioned there were issues with her taking prednisone  in the past related to her iritis-I have asked APP to clarify this with her.  Eugenia Hess, MD

## 2023-11-05 NOTE — Patient Instructions (Signed)
 Your provider has requested that you go to the basement level for lab work before leaving today. Press "B" on the elevator. The lab is located at the first door on the left as you exit the elevator.    _______________________________________________________  If your blood pressure at your visit was 140/90 or greater, please contact your primary care physician to follow up on this.  _______________________________________________________  If you are age 69 or older, your body mass index should be between 23-30. Your Body mass index is 29.62 kg/m. If this is out of the aforementioned range listed, please consider follow up with your Primary Care Provider.  If you are age 53 or younger, your body mass index should be between 19-25. Your Body mass index is 29.62 kg/m. If this is out of the aformentioned range listed, please consider follow up with your Primary Care Provider.   ________________________________________________________  The [facility] providers would like to encourage you to use MYCHART to communicate with providers for non-urgent requests or questions.  Due to long hold times on the telephone, sending your provider a message by [facility] may be a faster and more efficient way to get a response.  Please allow 48 business hours for a response.  Please remember that this is for non-urgent requests.  _______________________________________________________    Thank you for trusting me with your gastrointestinal care. Deanna May, RNP

## 2023-11-06 ENCOUNTER — Ambulatory Visit: Payer: Self-pay | Admitting: Gastroenterology

## 2023-11-06 ENCOUNTER — Other Ambulatory Visit: Payer: Self-pay | Admitting: Gastroenterology

## 2023-11-06 ENCOUNTER — Encounter: Payer: Self-pay | Admitting: Hematology and Oncology

## 2023-11-06 ENCOUNTER — Telehealth: Payer: Self-pay

## 2023-11-06 ENCOUNTER — Other Ambulatory Visit (HOSPITAL_COMMUNITY): Payer: Self-pay

## 2023-11-06 DIAGNOSIS — K5150 Left sided colitis without complications: Secondary | ICD-10-CM

## 2023-11-06 LAB — IRON,TIBC AND FERRITIN PANEL
%SAT: 26 % (ref 16–45)
Ferritin: 51 ng/mL (ref 16–288)
Iron: 86 ug/dL (ref 45–160)
TIBC: 333 ug/dL (ref 250–450)

## 2023-11-06 MED ORDER — MESALAMINE 1.2 G PO TBEC: 4.8000 g | DELAYED_RELEASE_TABLET | Freq: Every day | ORAL | 2 refills | Status: DC

## 2023-11-06 NOTE — Progress Notes (Signed)
 Spoke with patient to discuss plan of care. Dr. Yvone Herd wanted me to enquire about history of iritis and steroid use. Pt states her opthalamologist did mention it Alison Kramer have increased the risk of her having it but states they did not tell her to avoid them. She agrees to restart mesalamine 4.8grams for maintenance until we get ehr labs and stool tests back.

## 2023-11-06 NOTE — Telephone Encounter (Signed)
 Pharmacy Patient Advocate Encounter   Received notification from CoverMyMeds that prior authorization for Mesalamine 1.2GM dr tablets is required/requested.   Insurance verification completed.   The patient is insured through [place] .   Per test claim:  Mesalamine ER 0.375, Sulfasalazine, and Balsalazide is preferred by the insurance.  If suggested medication is appropriate, Please send in a new RX and discontinue this one. If not, please advise as to why it's not appropriate so that we may request a Prior Authorization. Please note, some preferred medications may still require a PA.  If the suggested medications have not been trialed and there are no contraindications to their use, the PA will not be submitted, as it will not be approved.

## 2023-11-07 ENCOUNTER — Other Ambulatory Visit

## 2023-11-07 DIAGNOSIS — K5150 Left sided colitis without complications: Secondary | ICD-10-CM

## 2023-11-08 LAB — GI PROFILE, STOOL, PCR

## 2023-11-09 LAB — CALPROTECTIN, FECAL: Calprotectin, Fecal: 732 ug/g — ABNORMAL HIGH (ref 0–120)

## 2023-11-12 MED ORDER — MESALAMINE ER 0.375 G PO CP24: 1500.0000 mg | ORAL_CAPSULE | Freq: Every day | ORAL | 5 refills | Status: DC

## 2023-11-12 NOTE — Addendum Note (Signed)
 Addended by: Fallan Mccarey N on: 11/12/2023 09:47 AM   Modules accepted: Orders

## 2023-11-12 NOTE — Telephone Encounter (Signed)
 mesalamine  ER 1.5 g (4 tabs)  daily sent to [facility] pharmacy on file. Patient notified via MyChart.

## 2024-03-25 DIAGNOSIS — E669 Obesity, unspecified: Secondary | ICD-10-CM | POA: Diagnosis not present

## 2024-04-22 ENCOUNTER — Ambulatory Visit: Payer: Self-pay | Admitting: Family Medicine

## 2024-04-22 ENCOUNTER — Encounter: Payer: Self-pay | Admitting: Family Medicine

## 2024-04-22 ENCOUNTER — Ambulatory Visit: Payer: Medicare HMO

## 2024-04-22 VITALS — BP 128/76 | Ht 64.0 in | Wt 184.3 lb

## 2024-04-22 DIAGNOSIS — Z6831 Body mass index (BMI) 31.0-31.9, adult: Secondary | ICD-10-CM

## 2024-04-22 DIAGNOSIS — Z0000 Encounter for general adult medical examination without abnormal findings: Secondary | ICD-10-CM | POA: Diagnosis not present

## 2024-04-22 DIAGNOSIS — M7061 Trochanteric bursitis, right hip: Secondary | ICD-10-CM | POA: Diagnosis not present

## 2024-04-22 DIAGNOSIS — K5150 Left sided colitis without complications: Secondary | ICD-10-CM | POA: Diagnosis not present

## 2024-04-22 DIAGNOSIS — E559 Vitamin D deficiency, unspecified: Secondary | ICD-10-CM

## 2024-04-22 DIAGNOSIS — M7062 Trochanteric bursitis, left hip: Secondary | ICD-10-CM | POA: Diagnosis not present

## 2024-04-22 DIAGNOSIS — Z1231 Encounter for screening mammogram for malignant neoplasm of breast: Secondary | ICD-10-CM

## 2024-04-22 DIAGNOSIS — E66811 Obesity, class 1: Secondary | ICD-10-CM | POA: Diagnosis not present

## 2024-04-22 DIAGNOSIS — Z0001 Encounter for general adult medical examination with abnormal findings: Secondary | ICD-10-CM | POA: Diagnosis not present

## 2024-04-22 MED ORDER — METHYLPREDNISOLONE ACETATE 40 MG/ML IJ SUSP
40.0000 mg | Freq: Once | INTRAMUSCULAR | Status: AC
  Administered 2024-04-22: 40 mg via INTRAMUSCULAR

## 2024-04-22 MED ORDER — LIDOCAINE-EPINEPHRINE (PF) 1 %-1:200000 IJ SOLN
10.0000 mL | Freq: Once | INTRAMUSCULAR | Status: AC
  Administered 2024-04-22: 4 mL via INTRADERMAL

## 2024-04-22 NOTE — Patient Instructions (Addendum)
 Ms. Burkle,  Thank you for taking the time for your Medicare Wellness Visit. I appreciate your continued commitment to your health goals. Please review the care plan we discussed, and feel free to reach out if I can assist you further.  Please note that Annual Wellness Visits do not include a physical exam. Some assessments may be limited, especially if the visit was conducted virtually. If needed, we may recommend an in-person follow-up with your provider.  Ongoing Care Seeing your primary care provider every 3 to 6 months helps us  monitor your health and provide consistent, personalized care.   Referrals If a referral was made during today's visit and you haven't received any updates within two weeks, please contact the referred provider directly to check on the status.  Recommended Screenings:  Health Maintenance  Topic Date Due   COVID-19 Vaccine (1) Never done   Colon Cancer Screening  08/31/2023   Flu Shot  01/04/2024   Medicare Annual Wellness Visit  04/22/2025   Breast Cancer Screening  05/13/2025   DEXA scan (bone density measurement)  01/08/2026   DTaP/Tdap/Td vaccine (3 - Td or Tdap) 06/21/2028   Pneumococcal Vaccine for age over 62  Completed   Hepatitis C Screening  Completed   Zoster (Shingles) Vaccine  Completed   Meningitis B Vaccine  Aged Out   Hepatitis B Vaccine  Discontinued     Vision: Annual vision screenings are recommended for early detection of glaucoma, cataracts, and diabetic retinopathy. These exams can also reveal signs of chronic conditions such as diabetes and high blood pressure.  Dental: Annual dental screenings help detect early signs of oral cancer, gum disease, and other conditions linked to overall health, including heart disease and diabetes.  Please see the attached documents for additional preventive care recommendations.   NEXT AWV 04/28/25 @ 2:30 PM IN PERSON

## 2024-04-22 NOTE — Progress Notes (Signed)
 Complete physical exam   Patient: Alison Kramer   DOB: 01-27-1956   69 y.o. Female  MRN: [id number] Visit Date: 04/22/2024  Today's healthcare provider: Jon Eva, MD   Chief Complaint  Patient presents with   Annual Exam    Last completed  Diet -  general Exercise - four days a week for one hour Feeling - well Sleeping - well Concerns - yes    Subjective    Alison Kramer is a 68 y.o. female who presents today for a complete physical exam.  Discussed the use of AI scribe software for clinical note transcription with the patient, who gave verbal consent to proceed.  History of Present Illness   Alison Kramer is a 68 year old female who presents for an annual physical exam and evaluation of hip pain.  She experiences bursitis primarily in her right hip, with some involvement in the left hip, worsening over the past six months. Ibuprofen provides minimal relief. Pain worsens when sleeping on the affected side and with prolonged immobility. She has not received injections for this condition. Pain radiates down her leg, particularly in the groin area, leading to a recent chiropractor visit. X-rays were performed, providing some relief, though soreness persists. She plans to discuss the x-ray results with the chiropractor soon.  Her medical history includes ulcerative colitis, with a colonoscopy last year revealing benign polyps. She experienced a flare-up in the spring and has not seen her gastroenterologist recently. She is up to date on vaccinations, including tetanus, shingles, and pneumonia. Her mammogram is due next month, and she prefers annual screenings. Her last labs in June showed low vitamin D , and her cholesterol has not been checked in a year.        Last depression screening scores    04/22/2024    2:00 PM 04/17/2023    1:26 PM 04/06/2022    2:18 PM  PHQ 2/9 Scores  PHQ - 2 Score 0 0 0  PHQ- 9 Score 0 3  6      Data saved with a previous flowsheet  row definition   Last fall risk screening    04/22/2024    1:54 PM  Fall Risk   Falls in the past year? 0  Number falls in past yr: 0  Injury with Fall? 0  Risk for fall due to : No Fall Risks  Follow up Falls evaluation completed;Falls prevention discussed        Medications: Outpatient Medications Prior to Visit  Medication Sig   buPROPion (WELLBUTRIN SR) 150 MG 12 hr tablet Take 1 tablet by mouth 2 (two) times daily.   cetirizine  (ZYRTEC ) 10 MG tablet Take 1 tablet (10 mg total) by mouth daily.   cholecalciferol  (VITAMIN D3) 25 MCG (1000 UNIT) tablet Take 1,000 Units by mouth daily.   Coenzyme Q10 100 MG capsule Take 100 mg by mouth daily.   Misc Natural Products (HORNY GOAT WEED PO) Take 1 tablet by mouth daily.   naltrexone (DEPADE) 50 MG tablet Take 50 mg by mouth daily. (Patient taking differently: Take 50 mg by mouth daily. TAKES 100MG  PER DAY)   Omega-3 Fatty Acids (FISH OIL) 1000 MG CAPS Take 1,000 mg by mouth daily.    omeprazole (PRILOSEC) 20 MG capsule Take 20 mg by mouth daily.   Probiotic Product (PROBIOTIC ADVANCED PO) Take 1 tablet by mouth daily.    RESVERATROL PO Take 1 tablet by mouth daily.   Turmeric 400  MG CAPS Take 400 mg by mouth daily.   Zinc Acetate, Oral, (ZINC ACETATE PO) Take 1 tablet by mouth daily.   [DISCONTINUED] mesalamine  (APRISO ) 0.375 g 24 hr capsule Take 4 capsules (1.5 g total) by mouth daily.   No facility-administered medications prior to visit.    Review of Systems    Objective    BP 128/76   Ht 5' 4 (1.626 m)   Wt 184 lb 4.8 oz (83.6 kg)   SpO2 100%   BMI 31.64 kg/m    Physical Exam Vitals reviewed.  Constitutional:      General: She is not in acute distress.    Appearance: Normal appearance. She is well-developed. She is not diaphoretic.  HENT:     Head: Normocephalic and atraumatic.     Right Ear: Tympanic membrane, ear canal and external ear normal.     Left Ear: Tympanic membrane, ear canal and external ear  normal.     Nose: Nose normal.     Mouth/Throat:     Mouth: Mucous membranes are moist.     Pharynx: Oropharynx is clear. No oropharyngeal exudate.  Eyes:     General: No scleral icterus.    Conjunctiva/sclera: Conjunctivae normal.     Pupils: Pupils are equal, round, and reactive to light.  Neck:     Thyroid : No thyromegaly.  Cardiovascular:     Rate and Rhythm: Normal rate and regular rhythm.     Heart sounds: Normal heart sounds. No murmur heard. Pulmonary:     Effort: Pulmonary effort is normal. No respiratory distress.     Breath sounds: Normal breath sounds. No wheezing or rales.  Abdominal:     General: There is no distension.     Palpations: Abdomen is soft.     Tenderness: There is no abdominal tenderness.  Musculoskeletal:        General: No deformity.     Cervical back: Neck supple.     Right lower leg: No edema.     Left lower leg: No edema.     Comments: TTP over b/l greater trochanters  Lymphadenopathy:     Cervical: No cervical adenopathy.  Skin:    General: Skin is warm and dry.     Findings: No rash.  Neurological:     Mental Status: She is alert and oriented to person, place, and time. Mental status is at baseline.     Gait: Gait normal.  Psychiatric:        Mood and Affect: Mood normal.        Behavior: Behavior normal.        Thought Content: Thought content normal.      No results found for any visits on 04/22/24.  Assessment & Plan    Routine Health Maintenance and Physical Exam  Exercise Activities and Dietary recommendations  Goals      care coordination activities     Care Coordination Interventions:   Needs assessment completed, SDOH screening completed AWV confirmed to be completed on 04/04/21 PCP visit previously scheduled for 04/06/22 Case management services introduced, patient verbalized having no additional needs at this time     DIET - EAT MORE FRUITS AND VEGETABLES        Immunization History  Administered Date(s)  Administered   Hepatitis B, ADULT 12/11/2017, 01/08/2018, 06/21/2018   Influenza,inj,Quad PF,6+ Mos 04/12/2015, 04/01/2018   PNEUMOCOCCAL CONJUGATE-20 04/06/2022   Pneumococcal Polysaccharide-23 04/01/2018   Td 06/21/2018   Tdap 02/14/2008   Zoster Recombinant(Shingrix )  12/11/2017, 02/11/2018   Zoster, Live 05/12/2011    Health Maintenance  Topic Date Due   COVID-19 Vaccine (1) Never done   Influenza Vaccine  09/02/2024 (Originally 01/04/2024)   Colonoscopy  08/30/2024   Medicare Annual Wellness (AWV)  04/22/2025   Mammogram  05/13/2025   DEXA SCAN  01/08/2026   DTaP/Tdap/Td (3 - Td or Tdap) 06/21/2028   Pneumococcal Vaccine: 50+ Years  Completed   Hepatitis C Screening  Completed   Zoster Vaccines- Shingrix   Completed   Meningococcal B Vaccine  Aged Out   Hepatitis B Vaccines 19-59 Average Risk  Discontinued    Discussed health benefits of physical activity, and encouraged her to engage in regular exercise appropriate for her age and condition.  Problem List Items Addressed This Visit       Digestive   Ulcerative colitis, left sided, chronic (HCC)     Other   Class 1 obesity without serious comorbidity with body mass index (BMI) of 31.0 to 31.9 in adult   Relevant Orders   Lipid panel   VITAMIN D  25 Hydroxy (Vit-D Deficiency, Fractures)   Comprehensive metabolic panel with GFR   Other Visit Diagnoses       Encounter for annual physical exam    -  Primary   Relevant Orders   Lipid panel   VITAMIN D  25 Hydroxy (Vit-D Deficiency, Fractures)   Comprehensive metabolic panel with GFR     Breast cancer screening by mammogram       Relevant Orders   MM 3D SCREENING MAMMOGRAM BILATERAL BREAST     Avitaminosis D       Relevant Orders   VITAMIN D  25 Hydroxy (Vit-D Deficiency, Fractures)     Trochanteric bursitis of both hips               Adult Wellness Visit Routine adult wellness visit with well-controlled blood pressure. Up to date on tetanus, shingles, and  pneumonia vaccinations. Mammogram due next month. Colonoscopy recommended every two years due to adenomatous polyps and ulcerative colitis. - Scheduled mammogram for next month - Updated colonoscopy schedule to every two years - Ordered labs: kidney and liver function, cholesterol, and vitamin D   Bilateral hip bursitis Chronic bilateral hip bursitis, predominantly affecting the right hip, with worsening symptoms over the past six months. Ibuprofen has been minimally effective. Physical examination confirms bursitis in the right hip. - Administered steroid injection with 40 mg Depo-Medrol and 4 cc lidocaine  to both hips - Provided exercises for bursitis to be done in a week - Advised icing hips later tonight  Ulcerative colitis with history of adenomatous colon polyps Ulcerative colitis with quiescent colitis on recent biopsy. Adenomatous polyps present in previous colonoscopy. Colonoscopy recommended every two years due to adenomatous polyps and ulcerative colitis. - Scheduled colonoscopy for next March - Advised follow-up with GI specialist if not contacted by January  Vitamin D  deficiency Noted in June. - Ordered vitamin D  level      INJECTION: Patient was given informed consent,. Appropriate time out was taken. Area prepped and draped in usual sterile fashion. 40 cc of depo-medrol 40 mg/ml plus  4 cc of 1% lidocaine  with epinephrine was injected into each trochanteric bursa using a(n) lateral approach. The patient tolerated the procedure well. There were no complications. Post procedure instructions were given.   Return in about 1 year (around 04/22/2025) for CPE.     Jon Eva, MD  [facility] [phone] (phone) [phone] (fax)  [facility]

## 2024-04-22 NOTE — Progress Notes (Signed)
 No chief complaint on file.    Subjective:   Alison Kramer is a 68 y.o. female who presents for a Medicare Annual Wellness Visit.  Allergies (verified) Patient has no known allergies.   History: Past Medical History:  Diagnosis Date   ADHD (attention deficit hyperactivity disorder)    Anxiety    Colitis, ulcerative (HCC)    Depression    GERD (gastroesophageal reflux disease)    Past Surgical History:  Procedure Laterality Date   ABDOMINAL HYSTERECTOMY  1981   BACK SURGERY     BUNIONECTOMY     KNEE ARTHROPLASTY Right 05/20/2021   Procedure: COMPUTER ASSISTED TOTAL KNEE ARTHROPLASTY - RNFA;  Surgeon: Mardee Lynwood SQUIBB, MD;  Location: [facility] ORS;  Service: Orthopedics;  Laterality: Right;   Family History  Problem Relation Age of Onset   Breast cancer Mother    Hyperlipidemia Mother    Heart Problems Father    CAD Father    Pancreatic disease Father    Hypertension Brother    Obesity Brother    Heart Problems Brother    Diabetes Brother    Colon cancer Neg Hx    Social History   Occupational History   Occupation: Marketing Executive: [employer]  Tobacco Use   Smoking status: Never   Smokeless tobacco: Never  Vaping Use   Vaping status: Never Used  Substance and Sexual Activity   Alcohol use: Yes    Alcohol/week: 1.0 standard drink of alcohol    Types: 1 Glasses of wine per week    Comment: Most Nights   Drug use: No   Sexual activity: Yes    Birth control/protection: None   Tobacco Counseling Counseling given: Not Answered  SDOH Screenings   Food Insecurity: No Food Insecurity (04/22/2024)  Housing: Low Risk  (04/22/2024)  Transportation Needs: No Transportation Needs (04/22/2024)  Utilities: Not At Risk (04/13/2023)  Alcohol Screen: Low Risk  (04/22/2024)  Depression (PHQ2-9): Low Risk  (04/17/2023)  Financial Resource Strain: Low Risk  (04/22/2024)  Physical Activity: Sufficiently Active (04/22/2024)  Social Connections: Socially  Integrated (04/22/2024)  Stress: No Stress Concern Present (04/22/2024)  Tobacco Use: Low Risk  (11/05/2023)   See flowsheets for full screening details  Depression Screen     Goals Addressed   None    Functional Status Activities of Daily Living (to include ambulation/medication): (Patient-Rptd) Independent Ambulation: (Patient-Rptd) Independent Home Management: (Patient-Rptd) Independent  Fall Screening Falls in the past year?: (Patient-Rptd) 0 Number of falls in past year: (Patient-Rptd) 0 Was there an injury with Fall?: (Patient-Rptd) 0 Fall Risk Category Calculator: (Patient-Rptd) 0 Patient Fall Risk Level: (Patient-Rptd) Low Fall Risk  Fall Risk Patient at Risk for Falls Due to: No Fall Risks Fall risk Follow up: Falls evaluation completed        Objective:    There were no vitals filed for this visit. There is no height or weight on file to calculate BMI.  Current Medications (verified) Outpatient Encounter Medications as of 04/22/2024  Medication Sig   buPROPion (WELLBUTRIN SR) 150 MG 12 hr tablet Take 1 tablet by mouth 2 (two) times daily.   cetirizine  (ZYRTEC ) 10 MG tablet Take 1 tablet (10 mg total) by mouth daily.   Coenzyme Q10 100 MG capsule Take 100 mg by mouth daily.   mesalamine  (APRISO ) 0.375 g 24 hr capsule Take 4 capsules (1.5 g total) by mouth daily.   Misc Natural Products (HORNY GOAT WEED PO) Take 1 tablet by mouth  daily.   naltrexone (DEPADE) 50 MG tablet Take 50 mg by mouth daily.   Omega-3 Fatty Acids (FISH OIL) 1000 MG CAPS Take 1,000 mg by mouth daily.    Probiotic Product (PROBIOTIC ADVANCED PO) Take 1 tablet by mouth daily.    RESVERATROL PO Take 1 tablet by mouth daily.   Turmeric 400 MG CAPS Take 400 mg by mouth daily.   Zinc Acetate, Oral, (ZINC ACETATE PO) Take 1 tablet by mouth daily.   No facility-administered encounter medications on file as of 04/22/2024.   Hearing/Vision screen No results found. Immunizations and Health  Maintenance Health Maintenance  Topic Date Due   COVID-19 Vaccine (1) Never done   Colonoscopy  08/31/2023   Influenza Vaccine  01/04/2024   Medicare Annual Wellness (AWV)  04/16/2024   Mammogram  05/13/2025   DTaP/Tdap/Td (3 - Td or Tdap) 06/21/2028   Pneumococcal Vaccine: 50+ Years  Completed   DEXA SCAN  Completed   Hepatitis C Screening  Completed   Zoster Vaccines- Shingrix   Completed   Meningococcal B Vaccine  Aged Out   Hepatitis B Vaccines 19-59 Average Risk  Discontinued        Assessment/Plan:  This is a routine wellness examination for [name].  Patient Care Team: Myrla Jon HERO, MD as PCP - General (Family Medicine)  I have personally reviewed and noted the following in the patient's chart:   Medical and social history Use of alcohol, tobacco or illicit drugs  Current medications and supplements including opioid prescriptions. Functional ability and status Nutritional status Physical activity Advanced directives List of other physicians Hospitalizations, surgeries, and ER visits in previous 12 months Vitals Screenings to include cognitive, depression, and falls Referrals and appointments  No orders of the defined types were placed in this encounter.  In addition, I have reviewed and discussed with patient certain preventive protocols, quality metrics, and best practice recommendations. A written personalized care plan for preventive services as well as general preventive health recommendations were provided to patient.   Jhonnie GORMAN Das, LPN   [id number]   No follow-ups on file.  After Visit Summary: (In Person-Declined) Patient declined AVS at this time.  Nurse Notes: UTD ON SHOTS; DOESN'T WANT FLU OR COVID SHOTS; WILL DO MAMMOGRAM NEXT YEAR; NEEDS APPT FOR COLONOSCOPY (DONE AT [facility]); UTD ON BDS

## 2024-04-23 ENCOUNTER — Ambulatory Visit: Payer: Self-pay | Admitting: Family Medicine

## 2024-04-23 DIAGNOSIS — M62830 Muscle spasm of back: Secondary | ICD-10-CM | POA: Diagnosis not present

## 2024-04-23 DIAGNOSIS — M25559 Pain in unspecified hip: Secondary | ICD-10-CM

## 2024-04-23 DIAGNOSIS — M9903 Segmental and somatic dysfunction of lumbar region: Secondary | ICD-10-CM | POA: Diagnosis not present

## 2024-04-23 DIAGNOSIS — M9904 Segmental and somatic dysfunction of sacral region: Secondary | ICD-10-CM | POA: Diagnosis not present

## 2024-04-23 DIAGNOSIS — M545 Low back pain, unspecified: Secondary | ICD-10-CM | POA: Diagnosis not present

## 2024-04-23 LAB — LIPID PANEL
Chol/HDL Ratio: 3.3 ratio (ref 0.0–4.4)
Cholesterol, Total: 232 mg/dL — ABNORMAL HIGH (ref 100–199)
HDL: 70 mg/dL (ref >39)
LDL Chol Calc (NIH): 139 mg/dL — ABNORMAL HIGH (ref 0–99)
Triglycerides: 132 mg/dL (ref 0–149)
VLDL Cholesterol Cal: 23 mg/dL (ref 5–40)

## 2024-04-23 LAB — COMPREHENSIVE METABOLIC PANEL WITH GFR
ALT: 16 IU/L (ref 0–32)
AST: 26 IU/L (ref 0–40)
Albumin: 4.5 g/dL (ref 3.9–4.9)
Alkaline Phosphatase: 67 IU/L (ref 49–135)
BUN/Creatinine Ratio: 18 (ref 12–28)
BUN: 18 mg/dL (ref 8–27)
Bilirubin Total: 0.4 mg/dL (ref 0.0–1.2)
CO2: 24 mmol/L (ref 20–29)
Calcium: 9.5 mg/dL (ref 8.7–10.3)
Chloride: 105 mmol/L (ref 96–106)
Creatinine, Ser: 1 mg/dL (ref 0.57–1.00)
Globulin, Total: 2.1 g/dL (ref 1.5–4.5)
Glucose: 81 mg/dL (ref 70–99)
Potassium: 4.2 mmol/L (ref 3.5–5.2)
Sodium: 142 mmol/L (ref 134–144)
Total Protein: 6.6 g/dL (ref 6.0–8.5)
eGFR: 61 mL/min/1.73 (ref >59)

## 2024-04-23 LAB — VITAMIN D 25 HYDROXY (VIT D DEFICIENCY, FRACTURES): Vit D, 25-Hydroxy: 35.5 ng/mL (ref 30.0–100.0)

## 2024-04-25 DIAGNOSIS — M545 Low back pain, unspecified: Secondary | ICD-10-CM | POA: Diagnosis not present

## 2024-04-25 DIAGNOSIS — M62830 Muscle spasm of back: Secondary | ICD-10-CM | POA: Diagnosis not present

## 2024-04-25 DIAGNOSIS — M9903 Segmental and somatic dysfunction of lumbar region: Secondary | ICD-10-CM | POA: Diagnosis not present

## 2024-04-25 DIAGNOSIS — M9904 Segmental and somatic dysfunction of sacral region: Secondary | ICD-10-CM | POA: Diagnosis not present

## 2024-04-27 NOTE — Telephone Encounter (Signed)
 Recommend referral to [facility]

## 2024-04-28 DIAGNOSIS — M545 Low back pain, unspecified: Secondary | ICD-10-CM | POA: Diagnosis not present

## 2024-04-28 DIAGNOSIS — M9903 Segmental and somatic dysfunction of lumbar region: Secondary | ICD-10-CM | POA: Diagnosis not present

## 2024-04-28 DIAGNOSIS — M62830 Muscle spasm of back: Secondary | ICD-10-CM | POA: Diagnosis not present

## 2024-04-28 DIAGNOSIS — M9904 Segmental and somatic dysfunction of sacral region: Secondary | ICD-10-CM | POA: Diagnosis not present

## 2024-04-30 DIAGNOSIS — M62830 Muscle spasm of back: Secondary | ICD-10-CM | POA: Diagnosis not present

## 2024-04-30 DIAGNOSIS — M9903 Segmental and somatic dysfunction of lumbar region: Secondary | ICD-10-CM | POA: Diagnosis not present

## 2024-04-30 DIAGNOSIS — M545 Low back pain, unspecified: Secondary | ICD-10-CM | POA: Diagnosis not present

## 2024-04-30 DIAGNOSIS — M9904 Segmental and somatic dysfunction of sacral region: Secondary | ICD-10-CM | POA: Diagnosis not present

## 2024-05-05 DIAGNOSIS — M9904 Segmental and somatic dysfunction of sacral region: Secondary | ICD-10-CM | POA: Diagnosis not present

## 2024-05-05 DIAGNOSIS — M62830 Muscle spasm of back: Secondary | ICD-10-CM | POA: Diagnosis not present

## 2024-05-05 DIAGNOSIS — M545 Low back pain, unspecified: Secondary | ICD-10-CM | POA: Diagnosis not present

## 2024-05-05 DIAGNOSIS — M9903 Segmental and somatic dysfunction of lumbar region: Secondary | ICD-10-CM | POA: Diagnosis not present

## 2024-05-07 DIAGNOSIS — M545 Low back pain, unspecified: Secondary | ICD-10-CM | POA: Diagnosis not present

## 2024-05-07 DIAGNOSIS — M9903 Segmental and somatic dysfunction of lumbar region: Secondary | ICD-10-CM | POA: Diagnosis not present

## 2024-05-07 DIAGNOSIS — M62830 Muscle spasm of back: Secondary | ICD-10-CM | POA: Diagnosis not present

## 2024-05-07 DIAGNOSIS — M9904 Segmental and somatic dysfunction of sacral region: Secondary | ICD-10-CM | POA: Diagnosis not present

## 2025-04-28 ENCOUNTER — Ambulatory Visit

## 2025-04-28 ENCOUNTER — Encounter: Admitting: Family Medicine

## 2025-05-04 ENCOUNTER — Encounter: Admitting: Family Medicine
